# Patient Record
Sex: Female | Born: 1975 | Race: Black or African American | Hispanic: No | Marital: Single | Smoking: Never smoker
Health system: Southern US, Community
[De-identification: ages and names within clinical notes are randomized; demographics above are authoritative.]

## PROBLEM LIST (undated history)

## (undated) DIAGNOSIS — B977 Papillomavirus as the cause of diseases classified elsewhere: Secondary | ICD-10-CM

## (undated) DIAGNOSIS — N809 Endometriosis, unspecified: Secondary | ICD-10-CM

## (undated) DIAGNOSIS — F32A Depression, unspecified: Secondary | ICD-10-CM

## (undated) DIAGNOSIS — K219 Gastro-esophageal reflux disease without esophagitis: Secondary | ICD-10-CM

## (undated) DIAGNOSIS — F419 Anxiety disorder, unspecified: Secondary | ICD-10-CM

## (undated) DIAGNOSIS — D219 Benign neoplasm of connective and other soft tissue, unspecified: Secondary | ICD-10-CM

## (undated) DIAGNOSIS — D649 Anemia, unspecified: Secondary | ICD-10-CM

## (undated) DIAGNOSIS — F329 Major depressive disorder, single episode, unspecified: Secondary | ICD-10-CM

## (undated) DIAGNOSIS — G43909 Migraine, unspecified, not intractable, without status migrainosus: Secondary | ICD-10-CM

## (undated) DIAGNOSIS — E079 Disorder of thyroid, unspecified: Secondary | ICD-10-CM

## (undated) HISTORY — PX: BUNIONECTOMY: SHX129

## (undated) HISTORY — PX: DILATION AND CURETTAGE OF UTERUS: SHX78

## (undated) HISTORY — DX: Major depressive disorder, single episode, unspecified: F32.9

## (undated) HISTORY — PX: ECTOPIC PREGNANCY SURGERY: SHX613

## (undated) HISTORY — DX: Anemia, unspecified: D64.9

## (undated) HISTORY — DX: Papillomavirus as the cause of diseases classified elsewhere: B97.7

## (undated) HISTORY — DX: Migraine, unspecified, not intractable, without status migrainosus: G43.909

## (undated) HISTORY — DX: Disorder of thyroid, unspecified: E07.9

## (undated) HISTORY — DX: Depression, unspecified: F32.A

## (undated) HISTORY — DX: Endometriosis, unspecified: N80.9

## (undated) HISTORY — DX: Benign neoplasm of connective and other soft tissue, unspecified: D21.9

---

## 2000-10-24 ENCOUNTER — Inpatient Hospital Stay (HOSPITAL_COMMUNITY): Admission: AD | Admit: 2000-10-24 | Discharge: 2000-10-24 | Payer: Self-pay | Admitting: Obstetrics & Gynecology

## 2000-10-29 ENCOUNTER — Emergency Department (HOSPITAL_COMMUNITY): Admission: EM | Admit: 2000-10-29 | Discharge: 2000-10-29 | Payer: Self-pay | Admitting: Emergency Medicine

## 2001-06-27 ENCOUNTER — Ambulatory Visit (HOSPITAL_COMMUNITY): Admission: RE | Admit: 2001-06-27 | Discharge: 2001-06-27 | Payer: Self-pay | Admitting: Family Medicine

## 2001-06-27 ENCOUNTER — Encounter: Payer: Self-pay | Admitting: Family Medicine

## 2001-07-01 ENCOUNTER — Emergency Department (HOSPITAL_COMMUNITY): Admission: EM | Admit: 2001-07-01 | Discharge: 2001-07-01 | Payer: Self-pay | Admitting: Emergency Medicine

## 2001-07-03 ENCOUNTER — Emergency Department (HOSPITAL_COMMUNITY): Admission: EM | Admit: 2001-07-03 | Discharge: 2001-07-04 | Payer: Self-pay | Admitting: Emergency Medicine

## 2001-07-04 ENCOUNTER — Encounter: Payer: Self-pay | Admitting: Emergency Medicine

## 2005-01-12 ENCOUNTER — Encounter: Admission: RE | Admit: 2005-01-12 | Discharge: 2005-01-12 | Payer: Self-pay | Admitting: *Deleted

## 2008-05-14 ENCOUNTER — Emergency Department (HOSPITAL_COMMUNITY): Admission: EM | Admit: 2008-05-14 | Discharge: 2008-05-14 | Payer: Self-pay | Admitting: Emergency Medicine

## 2008-10-08 ENCOUNTER — Emergency Department (HOSPITAL_COMMUNITY): Admission: EM | Admit: 2008-10-08 | Discharge: 2008-10-08 | Payer: Self-pay | Admitting: Emergency Medicine

## 2009-04-30 ENCOUNTER — Emergency Department (HOSPITAL_COMMUNITY): Admission: EM | Admit: 2009-04-30 | Discharge: 2009-04-30 | Payer: Self-pay | Admitting: Emergency Medicine

## 2009-05-03 ENCOUNTER — Emergency Department (HOSPITAL_COMMUNITY): Admission: EM | Admit: 2009-05-03 | Discharge: 2009-05-03 | Payer: Self-pay | Admitting: Family Medicine
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
## 2009-07-10 ENCOUNTER — Emergency Department (HOSPITAL_COMMUNITY): Admission: EM | Admit: 2009-07-10 | Discharge: 2009-07-10 | Payer: Self-pay | Admitting: Emergency Medicine

## 2009-12-28 ENCOUNTER — Ambulatory Visit: Payer: Self-pay | Admitting: Radiology

## 2009-12-28 ENCOUNTER — Emergency Department (HOSPITAL_BASED_OUTPATIENT_CLINIC_OR_DEPARTMENT_OTHER): Admission: EM | Admit: 2009-12-28 | Discharge: 2009-12-28 | Payer: Self-pay | Admitting: Emergency Medicine

## 2010-12-19 ENCOUNTER — Other Ambulatory Visit: Payer: Self-pay | Admitting: Internal Medicine

## 2010-12-19 DIAGNOSIS — N6019 Diffuse cystic mastopathy of unspecified breast: Secondary | ICD-10-CM

## 2010-12-30 ENCOUNTER — Inpatient Hospital Stay (INDEPENDENT_AMBULATORY_CARE_PROVIDER_SITE_OTHER)
Admission: RE | Admit: 2010-12-30 | Discharge: 2010-12-30 | Disposition: A | Discharge status: Home / Self Care | Payer: Private Health Insurance - Indemnity | Source: Ambulatory Visit | Attending: Family Medicine | Admitting: Family Medicine

## 2010-12-30 ENCOUNTER — Ambulatory Visit (HOSPITAL_COMMUNITY)
Admission: RE | Admit: 2010-12-30 | Discharge: 2010-12-30 | Disposition: A | Discharge status: Home / Self Care | Payer: Private Health Insurance - Indemnity | Source: Ambulatory Visit | Attending: Family Medicine | Admitting: Family Medicine

## 2010-12-30 DIAGNOSIS — S6990XA Unspecified injury of unspecified wrist, hand and finger(s), initial encounter: Secondary | ICD-10-CM | POA: Insufficient documentation

## 2010-12-30 DIAGNOSIS — X58XXXA Exposure to other specified factors, initial encounter: Secondary | ICD-10-CM | POA: Insufficient documentation

## 2010-12-30 DIAGNOSIS — T148XXA Other injury of unspecified body region, initial encounter: Secondary | ICD-10-CM

## 2011-01-02 ENCOUNTER — Ambulatory Visit
Admission: RE | Admit: 2011-01-02 | Discharge: 2011-01-02 | Disposition: A | Discharge status: Home / Self Care | Payer: Private Health Insurance - Indemnity | Source: Ambulatory Visit | Attending: Internal Medicine | Admitting: Internal Medicine

## 2011-01-02 DIAGNOSIS — N6019 Diffuse cystic mastopathy of unspecified breast: Secondary | ICD-10-CM

## 2011-02-15 LAB — BASIC METABOLIC PANEL
CO2: 27 mEq/L (ref 19–32)
Calcium: 8.9 mg/dL (ref 8.4–10.5)
Chloride: 105 mEq/L (ref 96–112)
GFR calc Af Amer: >60 mL/min (ref >60)
Potassium: 4 mEq/L (ref 3.5–5.1)
Sodium: 141 mEq/L (ref 135–145)

## 2011-02-15 LAB — URINALYSIS, ROUTINE W REFLEX MICROSCOPIC
Glucose, UA: NEGATIVE mg/dL
Protein, ur: NEGATIVE mg/dL
Urobilinogen, UA: 1 mg/dL (ref 0.0–1.0)

## 2011-02-15 LAB — CBC
HCT: 38 % (ref 36.0–46.0)
Hemoglobin: 12.6 g/dL (ref 12.0–15.0)
MCHC: 33.2 g/dL (ref 30.0–36.0)
MCV: 92.1 fL (ref 78.0–100.0)
RBC: 4.12 MIL/uL (ref 3.87–5.11)

## 2011-02-15 LAB — POCT CARDIAC MARKERS: CKMB, poc: <1 ng/mL — ABNORMAL LOW (ref 1.0–8.0)

## 2011-02-15 LAB — DIFFERENTIAL
Basophils Relative: 1 % (ref 0–1)
Monocytes Absolute: 0.6 10*3/uL (ref 0.1–1.0)
Monocytes Relative: 14 % — ABNORMAL HIGH (ref 3–12)
Neutro Abs: 2.6 10*3/uL (ref 1.7–7.7)

## 2011-03-05 LAB — BASIC METABOLIC PANEL
Calcium: 8.9 mg/dL (ref 8.4–10.5)
GFR calc Af Amer: >60 mL/min (ref >60)
GFR calc non Af Amer: >60 mL/min (ref >60)
Sodium: 138 mEq/L (ref 135–145)

## 2011-03-05 LAB — DIFFERENTIAL
Basophils Absolute: 0 10*3/uL (ref 0.0–0.1)
Lymphocytes Relative: 42 % (ref 12–46)
Monocytes Absolute: 0.2 10*3/uL (ref 0.1–1.0)
Monocytes Relative: 6 % (ref 3–12)
Neutro Abs: 2.3 10*3/uL (ref 1.7–7.7)

## 2011-03-05 LAB — URINALYSIS, ROUTINE W REFLEX MICROSCOPIC
Bilirubin Urine: NEGATIVE
Ketones, ur: NEGATIVE mg/dL
Nitrite: NEGATIVE
Protein, ur: NEGATIVE mg/dL
Urobilinogen, UA: 1 mg/dL (ref 0.0–1.0)

## 2011-03-05 LAB — PREGNANCY, URINE: Preg Test, Ur: NEGATIVE

## 2011-03-05 LAB — ACETAMINOPHEN LEVEL: Acetaminophen (Tylenol), Serum: <10 ug/mL — ABNORMAL LOW (ref 10–30)

## 2011-03-05 LAB — CBC
Hemoglobin: 12.2 g/dL (ref 12.0–15.0)
RBC: 3.99 MIL/uL (ref 3.87–5.11)
RDW: 13.7 % (ref 11.5–15.5)

## 2011-03-05 LAB — RAPID URINE DRUG SCREEN, HOSP PERFORMED: Tetrahydrocannabinol: NOT DETECTED

## 2011-06-10 ENCOUNTER — Emergency Department (HOSPITAL_COMMUNITY)
Admission: EM | Admit: 2011-06-10 | Discharge: 2011-06-10 | Disposition: A | Discharge status: Home / Self Care | Payer: Private Health Insurance - Indemnity | Attending: Emergency Medicine | Admitting: Emergency Medicine

## 2011-06-10 DIAGNOSIS — R51 Headache: Secondary | ICD-10-CM | POA: Insufficient documentation

## 2011-06-10 LAB — POCT I-STAT, CHEM 8
Creatinine, Ser: 0.9 mg/dL (ref 0.50–1.10)
Hemoglobin: 12.2 g/dL (ref 12.0–15.0)
Potassium: 4.6 mEq/L (ref 3.5–5.1)
Sodium: 143 mEq/L (ref 135–145)

## 2011-06-10 LAB — POCT PREGNANCY, URINE: Preg Test, Ur: NEGATIVE

## 2011-06-10 LAB — GLUCOSE, CAPILLARY: Glucose-Capillary: 80 mg/dL (ref 70–99)

## 2011-08-23 LAB — DIFFERENTIAL
Basophils Absolute: 0
Basophils Relative: 1
Eosinophils Absolute: 0.2
Eosinophils Relative: 3
Lymphocytes Relative: 42
Lymphs Abs: 2.8
Monocytes Absolute: 0.5
Monocytes Relative: 7
Neutro Abs: 3.1
Neutrophils Relative %: 47

## 2011-08-23 LAB — POCT I-STAT, CHEM 8
BUN: 20
Calcium, Ion: 1.25
Chloride: 106
Creatinine, Ser: 1.4 — ABNORMAL HIGH
Glucose, Bld: 81
HCT: 39
Hemoglobin: 13.3
Potassium: 4.3
Sodium: 139
TCO2: 26

## 2011-08-23 LAB — CBC
HCT: 37.2
Hemoglobin: 13
MCHC: 34.9
MCV: 91.2
Platelets: 247
RBC: 4.08
RDW: 13.3
WBC: 6.6

## 2011-08-23 LAB — POCT CARDIAC MARKERS
CKMB, poc: 1.4
Myoglobin, poc: 59.8
Operator id: 234501
Troponin i, poc: <0.05

## 2011-08-29 LAB — URINALYSIS, ROUTINE W REFLEX MICROSCOPIC
Bilirubin Urine: NEGATIVE
Ketones, ur: NEGATIVE
Nitrite: NEGATIVE
Protein, ur: NEGATIVE
Specific Gravity, Urine: 1.02
Urobilinogen, UA: 0.2

## 2012-07-22 ENCOUNTER — Ambulatory Visit: Payer: Private Health Insurance - Indemnity | Admitting: Endocrinology

## 2012-07-22 DIAGNOSIS — Z0289 Encounter for other administrative examinations: Secondary | ICD-10-CM

## 2012-09-29 ENCOUNTER — Other Ambulatory Visit: Payer: Self-pay | Admitting: Otolaryngology

## 2012-09-29 DIAGNOSIS — R490 Dysphonia: Secondary | ICD-10-CM

## 2012-09-29 DIAGNOSIS — K219 Gastro-esophageal reflux disease without esophagitis: Secondary | ICD-10-CM

## 2012-09-29 DIAGNOSIS — R1310 Dysphagia, unspecified: Secondary | ICD-10-CM

## 2012-10-06 ENCOUNTER — Ambulatory Visit
Admission: RE | Admit: 2012-10-06 | Discharge: 2012-10-06 | Disposition: A | Discharge status: Home / Self Care | Payer: Medicaid Other | Source: Ambulatory Visit | Attending: Otolaryngology | Admitting: Otolaryngology

## 2012-10-06 DIAGNOSIS — K219 Gastro-esophageal reflux disease without esophagitis: Secondary | ICD-10-CM

## 2012-10-06 DIAGNOSIS — R490 Dysphonia: Secondary | ICD-10-CM

## 2012-10-06 DIAGNOSIS — R1310 Dysphagia, unspecified: Secondary | ICD-10-CM

## 2013-05-21 ENCOUNTER — Emergency Department (HOSPITAL_COMMUNITY)
Admission: EM | Admit: 2013-05-21 | Discharge: 2013-05-21 | Disposition: A | Discharge status: Home / Self Care | Payer: Medicaid Other | Attending: Emergency Medicine | Admitting: Emergency Medicine

## 2013-05-21 ENCOUNTER — Encounter (HOSPITAL_COMMUNITY): Payer: Self-pay

## 2013-05-21 ENCOUNTER — Emergency Department (HOSPITAL_COMMUNITY): Payer: Medicaid Other

## 2013-05-21 DIAGNOSIS — IMO0002 Reserved for concepts with insufficient information to code with codable children: Secondary | ICD-10-CM | POA: Insufficient documentation

## 2013-05-21 DIAGNOSIS — X58XXXA Exposure to other specified factors, initial encounter: Secondary | ICD-10-CM | POA: Insufficient documentation

## 2013-05-21 DIAGNOSIS — F411 Generalized anxiety disorder: Secondary | ICD-10-CM | POA: Insufficient documentation

## 2013-05-21 DIAGNOSIS — R110 Nausea: Secondary | ICD-10-CM | POA: Insufficient documentation

## 2013-05-21 DIAGNOSIS — Z3202 Encounter for pregnancy test, result negative: Secondary | ICD-10-CM | POA: Insufficient documentation

## 2013-05-21 DIAGNOSIS — Z79899 Other long term (current) drug therapy: Secondary | ICD-10-CM | POA: Insufficient documentation

## 2013-05-21 DIAGNOSIS — Y939 Activity, unspecified: Secondary | ICD-10-CM | POA: Insufficient documentation

## 2013-05-21 DIAGNOSIS — Y929 Unspecified place or not applicable: Secondary | ICD-10-CM | POA: Insufficient documentation

## 2013-05-21 DIAGNOSIS — R109 Unspecified abdominal pain: Secondary | ICD-10-CM | POA: Insufficient documentation

## 2013-05-21 DIAGNOSIS — S39011A Strain of muscle, fascia and tendon of abdomen, initial encounter: Secondary | ICD-10-CM

## 2013-05-21 HISTORY — DX: Anxiety disorder, unspecified: F41.9

## 2013-05-21 LAB — CBC WITH DIFFERENTIAL/PLATELET
Basophils Absolute: 0 10*3/uL (ref 0.0–0.1)
Basophils Relative: 1 % (ref 0–1)
Eosinophils Relative: 1 % (ref 0–5)
HCT: 34.1 % — ABNORMAL LOW (ref 36.0–46.0)
MCHC: 34.9 g/dL (ref 30.0–36.0)
MCV: 87.9 fL (ref 78.0–100.0)
Monocytes Absolute: 0.4 10*3/uL (ref 0.1–1.0)
Monocytes Relative: 9 % (ref 3–12)
RDW: 12.6 % (ref 11.5–15.5)

## 2013-05-21 LAB — URINALYSIS, ROUTINE W REFLEX MICROSCOPIC
Bilirubin Urine: NEGATIVE
Glucose, UA: NEGATIVE mg/dL
Hgb urine dipstick: NEGATIVE
Ketones, ur: 15 mg/dL — AB
Protein, ur: NEGATIVE mg/dL

## 2013-05-21 LAB — POCT PREGNANCY, URINE: Preg Test, Ur: NEGATIVE

## 2013-05-21 LAB — BASIC METABOLIC PANEL
BUN: 14 mg/dL (ref 6–23)
CO2: 25 mEq/L (ref 19–32)
Calcium: 9.1 mg/dL (ref 8.4–10.5)
Creatinine, Ser: 1.07 mg/dL (ref 0.50–1.10)
GFR calc Af Amer: 75 mL/min — ABNORMAL LOW (ref >90)

## 2013-05-21 NOTE — ED Notes (Addendum)
This nurse went to discharge this patient but the patient was no longer in the room. Patient left the hospital prior to discharge instructions. No belongings left in room.

## 2013-05-21 NOTE — ED Provider Notes (Signed)
History    CSN: [id number] Arrival date & time 05/21/13  6962  First MD Initiated Contact with Patient 05/21/13 [phone]     Chief Complaint  Patient presents with  . Hip Pain   (Consider location/radiation/quality/duration/timing/severity/associated sxs/prior Treatment) Patient is a 37 y.o. female presenting with abdominal pain. The history is provided by the patient.  Abdominal Pain This is a new problem. The current episode started more than 1 month ago (about two months ago). The problem occurs constantly (intermittent at first, now constant for past week). The problem has been gradually worsening. Associated symptoms include abdominal pain and nausea. Pertinent negatives include no anorexia, chest pain, chills, congestion, fever, vomiting or weakness. Nothing aggravates the symptoms. She has tried nothing for the symptoms.   Past Medical History  Diagnosis Date  . Anxiety    Past Surgical History  Procedure Laterality Date  . Cesarean section    . Ectopic pregnancy surgery     No family history on file. History  Substance Use Topics  . Smoking status: Never Smoker   . Smokeless tobacco: Not on file  . Alcohol Use: No   OB History   Grav Para Term Preterm Abortions TAB SAB Ect Mult Living                 Review of Systems  Constitutional: Negative for fever and chills.  HENT: Negative for congestion and rhinorrhea.   Respiratory: Negative for chest tightness and shortness of breath.   Cardiovascular: Negative for chest pain.  Gastrointestinal: Positive for nausea and abdominal pain. Negative for vomiting, diarrhea and anorexia.  Genitourinary: Negative for dysuria, hematuria and difficulty urinating.  Musculoskeletal: Negative for back pain.  Neurological: Negative for dizziness and weakness.  All other systems reviewed and are negative.    Allergies  Penicillins and Latex  Home Medications  No current outpatient prescriptions on file. BP 107/73  Pulse 85   Temp(Src) 98.6 F (37 C) (Oral)  Resp 16  Ht 5\' 2"  (1.575 m)  Wt 170 lb (77.111 kg)  BMI 31.09 kg/m2  SpO2 100%  LMP 04/26/2013 Physical Exam  Nursing note and vitals reviewed. Constitutional: She is oriented to person, place, and time. She appears well-developed and well-nourished. No distress.  HENT:  Head: Normocephalic and atraumatic.  Mouth/Throat: Oropharynx is clear and moist.  Eyes: EOM are normal. Pupils are equal, round, and reactive to light.  Neck: Normal range of motion. Neck supple.  Cardiovascular: Normal rate, regular rhythm and normal heart sounds.  Exam reveals no friction rub.   No murmur heard. Pulmonary/Chest: Effort normal and breath sounds normal. No respiratory distress. She has no wheezes. She has no rales.  Abdominal: Soft. There is no tenderness. There is no rebound and no guarding.  No TTP right flank or abdomen to deep palpation.   Musculoskeletal: Normal range of motion. She exhibits no edema and no tenderness.  Lymphadenopathy:    She has no cervical adenopathy.  Neurological: She is alert and oriented to person, place, and time.  Skin: Skin is warm and dry. No rash noted.  Psychiatric: She has a normal mood and affect. Her behavior is normal.    ED Course  Procedures (including critical care time) Labs Reviewed  URINALYSIS, ROUTINE W REFLEX MICROSCOPIC - Abnormal; Notable for the following:    Color, Urine AMBER (*)    APPearance CLOUDY (*)    Specific Gravity, Urine 1.033 (*)    Ketones, ur 15 (*)    All  other components within normal limits  CBC WITH DIFFERENTIAL - Abnormal; Notable for the following:    WBC 3.8 (*)    Hemoglobin 11.9 (*)    HCT 34.1 (*)    All other components within normal limits  BASIC METABOLIC PANEL - Abnormal; Notable for the following:    GFR calc non Af Amer 65 (*)    GFR calc Af Amer 75 (*)    All other components within normal limits  POCT PREGNANCY, URINE   Ct Abdomen Pelvis Wo Contrast  05/21/2013    *RADIOLOGY REPORT*  Clinical Data: Right-sided hip, pelvic and flank pain for several weeks  CT ABDOMEN AND PELVIS WITHOUT CONTRAST  Technique:  Multidetector CT imaging of the abdomen and pelvis was performed following the standard protocol without intravenous contrast.  Comparison: None.  Findings:  The lack of intravenous contrast limits the ability to evaluate solid abdominal organs.  Normal noncontrast appearance of the bilateral kidneys.  No definite renal stones are seen within either kidney, the urinary bladder or along the expected location of either ureter.  Multiple phleboliths are seen within the pelvis.  Normal noncontrast appearance of the urinary bladder given under distension.  No urinary obstruction or perinephric stranding.  Normal hepatic contour.  Normal noncontrast appearance of a slightly under distended gallbladder.  No ascites.  Normal noncontrast appearance of the right adrenal gland, pancreas and spleen.  There is mild diffuse thickening of the left mid gland without discrete nodule on this noncontrast examination.  Moderate colonic stool burden without evidence of obstruction.  The colon is incidentally noted to be located within the right mid hemiabdomen.  No evidence of enteric obstruction.  Normal appearance of the appendix which is noted to extend caudally to the inferior aspect the right lobe of the liver.  No pneumoperitoneum, pneumatosis or portal venous gas.  Normal caliber of the abdominal aorta.  No definite retroperitoneal, mesenteric, pelvic or inguinal lymphadenopathy on this noncontrast examination.  Normal noncontrast appearance of the pelvic organs for age.  No discrete adnexal lesion.  No free fluid in the pelvis.  Limited visualization of the lower thorax is negative for focal airspace opacity or pleural effusion.  Normal heart size.  No pericardial effusion.  No acute or aggressive osseous abnormalities.  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  This  finding is without associated anterolisthesis.  Limited visualization of the bilateral hips is normal.  IMPRESSION:  1.  No explanation for patient's right sided hip, pelvic and flank pain.  Specifically, no evidence of nephrolithiasis, urinary or enteric obstruction.  2.  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   Original Report Authenticated By: Tacey Ruiz, MD   1. Abdominal pain   2. Abdominal wall strain, initial encounter     MDM  86:32 AM 37 year old female with no medical problems presenting with roughly 2 months of right flank pain. She states pain was initially intermittent, but for the past week has been constant and is now waking her up out of sleep. She endorses nausea but no vomiting. No fevers or diarrhea. No history of similar in the past. She denies any exacerbating or relieving factors. She states she was worked up with ultrasound by her primary physician which was negative. Reports last week her chiropractor did what sounds to be a lateral x-ray and told her there was a "mass". She appears well on exam. I am unable to elicit tenderness in her  abdomen with deep palpation. She does state the pain radiates to her groin, but she does not appear to have an acutely impacted stone. Will obtain CT stone protocol to evaluate but also to get a better look at her abdomen.  11:56 AM labs show no significant abnormality. CT unremarkable. Overall picture consistent with probable abdominal wall strain. Discussed with patient. She'll followup with her primary physician for reevaluation. Counseled on use of Motrin and Tylenol as needed for pain. She voiced understanding and was discharged home in stable condition.  Caren Hazy, MD 05/21/13 [phone]

## 2013-05-21 NOTE — ED Notes (Signed)
Dr. James at bedside  

## 2013-05-21 NOTE — ED Notes (Signed)
Per patient statement, right side pain x 3 months. Was told muscle spasms by PCP but second opinion byChiropractor who did back x-ray and found 2cm mass located on R hip area.  Pt states pain increasing.

## 2013-05-22 NOTE — ED Provider Notes (Signed)
I saw and evaluated the patient, reviewed the resident's note and I agree with the findings and plan. Abdominal "mass". CT scan done and is reassuring. Discharge home  Brenda Patton. Rubin Payor, MD 05/22/13 [phone]

## 2013-12-21 ENCOUNTER — Other Ambulatory Visit: Payer: Self-pay | Admitting: Medical

## 2013-12-22 ENCOUNTER — Other Ambulatory Visit: Payer: Self-pay | Admitting: Medical

## 2013-12-22 DIAGNOSIS — N6452 Nipple discharge: Secondary | ICD-10-CM

## 2013-12-22 DIAGNOSIS — N63 Unspecified lump in unspecified breast: Secondary | ICD-10-CM

## 2014-01-07 ENCOUNTER — Ambulatory Visit
Admission: RE | Admit: 2014-01-07 | Discharge: 2014-01-07 | Disposition: A | Discharge status: Home / Self Care | Payer: No Typology Code available for payment source | Source: Ambulatory Visit | Attending: Medical | Admitting: Medical

## 2014-01-07 ENCOUNTER — Other Ambulatory Visit: Payer: Self-pay | Admitting: Medical

## 2014-01-07 DIAGNOSIS — N6452 Nipple discharge: Secondary | ICD-10-CM

## 2014-01-07 DIAGNOSIS — N6320 Unspecified lump in the left breast, unspecified quadrant: Secondary | ICD-10-CM

## 2014-02-25 ENCOUNTER — Encounter (HOSPITAL_COMMUNITY): Payer: Self-pay | Admitting: Emergency Medicine

## 2014-02-25 ENCOUNTER — Emergency Department (HOSPITAL_COMMUNITY)
Admission: EM | Admit: 2014-02-25 | Discharge: 2014-02-25 | Disposition: A | Discharge status: Home / Self Care | Payer: No Typology Code available for payment source | Attending: Emergency Medicine | Admitting: Emergency Medicine

## 2014-02-25 ENCOUNTER — Emergency Department (HOSPITAL_COMMUNITY): Payer: No Typology Code available for payment source

## 2014-02-25 DIAGNOSIS — R0789 Other chest pain: Secondary | ICD-10-CM | POA: Insufficient documentation

## 2014-02-25 DIAGNOSIS — Z91040 Latex allergy status: Secondary | ICD-10-CM | POA: Insufficient documentation

## 2014-02-25 DIAGNOSIS — Z79899 Other long term (current) drug therapy: Secondary | ICD-10-CM | POA: Insufficient documentation

## 2014-02-25 DIAGNOSIS — R0602 Shortness of breath: Secondary | ICD-10-CM | POA: Insufficient documentation

## 2014-02-25 DIAGNOSIS — R079 Chest pain, unspecified: Secondary | ICD-10-CM

## 2014-02-25 DIAGNOSIS — F411 Generalized anxiety disorder: Secondary | ICD-10-CM | POA: Insufficient documentation

## 2014-02-25 DIAGNOSIS — Z880 Allergy status to penicillin: Secondary | ICD-10-CM | POA: Insufficient documentation

## 2014-02-25 LAB — CBC
HCT: 34.5 % — ABNORMAL LOW (ref 36.0–46.0)
HEMOGLOBIN: 11.7 g/dL — AB (ref 12.0–15.0)
MCH: 31 pg (ref 26.0–34.0)
MCHC: 33.9 g/dL (ref 30.0–36.0)
MCV: 91.3 fL (ref 78.0–100.0)
PLATELETS: 214 10*3/uL (ref 150–400)
RBC: 3.78 MIL/uL — AB (ref 3.87–5.11)
RDW: 12.9 % (ref 11.5–15.5)
WBC: 6.8 10*3/uL (ref 4.0–10.5)

## 2014-02-25 LAB — BASIC METABOLIC PANEL
BUN: 19 mg/dL (ref 6–23)
CHLORIDE: 106 meq/L (ref 96–112)
CO2: 22 meq/L (ref 19–32)
Calcium: 8.8 mg/dL (ref 8.4–10.5)
Creatinine, Ser: 1.17 mg/dL — ABNORMAL HIGH (ref 0.50–1.10)
GFR calc Af Amer: 67 mL/min — ABNORMAL LOW (ref >90)
GFR calc non Af Amer: 58 mL/min — ABNORMAL LOW (ref >90)
Glucose, Bld: 98 mg/dL (ref 70–99)
Potassium: 4.1 mEq/L (ref 3.7–5.3)
SODIUM: 139 meq/L (ref 137–147)

## 2014-02-25 LAB — TROPONIN I

## 2014-02-25 MED ORDER — FAMOTIDINE 20 MG PO TABS: 20.0000 mg | ORAL_TABLET | Freq: Two times a day (BID) | ORAL | Status: DC

## 2014-02-25 NOTE — ED Notes (Signed)
The pt has had mid chest discomfort for the past 3 days with some sob.  None now.  She woke up this am and felt a heaviness in her chest.  No previous history.  lmp  March 15th

## 2014-02-25 NOTE — ED Provider Notes (Signed)
CSN: [id number]     Arrival date & time 02/25/14  0204 History   First MD Initiated Contact with Patient 02/25/14 [phone]     Chief Complaint  Patient presents with  . Chest Pain     (Consider location/radiation/quality/duration/timing/severity/associated sxs/prior Treatment) HPI Comments: 38 year old female with no Past medical history other than anxiety who presents with a complaint of chest pain. This is a discomfort that she has felt in her chest.  It is a heaviness or squeezing sensation, it started 3 days ago, it occurs only at night for the first 2 days and then has been present for the last 24 hours. Nothing seems to make this better or worse, it is not associated with position, supine position, exertion or deep breathing. She has some shortness of breath when she wakes up but this resolves spontaneously. She had some nausea this evening but this has resolved as well. She has no risk factors for heart disease, no risk factors for pulmonary embolism and denies any pain in her back, upper extremities or swelling of the lower extremities. She also denies fevers, cough. She had her regular yearly physical at her doctor's office 1 week ago at which time she states she had no symptoms but a screening EKG and chest x-ray were done, she reports them as normal  Patient is a 38 y.o. female presenting with chest pain. The history is provided by the patient.  Chest Pain   Past Medical History  Diagnosis Date  . Anxiety    Past Surgical History  Procedure Laterality Date  . Cesarean section    . Ectopic pregnancy surgery     No family history on file. History  Substance Use Topics  . Smoking status: Never Smoker   . Smokeless tobacco: Not on file  . Alcohol Use: No   OB History   Grav Para Term Preterm Abortions TAB SAB Ect Mult Living                 Review of Systems  Cardiovascular: Positive for chest pain.  All other systems reviewed and are negative.      Allergies   Penicillins and Latex  Home Medications   Current Outpatient Rx  Name  Route  Sig  Dispense  Refill  . clonazePAM (KLONOPIN) 0.5 MG tablet   Oral   Take 0.5 mg by mouth at bedtime as needed for anxiety.         . Multiple Vitamin (MULTI-VITAMIN DAILY PO)   Oral   Take 1 tablet by mouth daily.         . propranolol (INDERAL) 10 MG tablet   Oral   Take 10 mg by mouth 3 (three) times daily.         . SUMAtriptan (IMITREX) 25 MG tablet   Oral   Take 25 mg by mouth every 2 (two) hours as needed for migraine or headache. May repeat in 2 hours if headache persists or recurs.         . famotidine (PEPCID) 20 MG tablet   Oral   Take 1 tablet (20 mg total) by mouth 2 (two) times daily.   30 tablet   0    BP 107/66  Pulse 66  Temp(Src) 98 F (36.7 C) (Oral)  Resp 13  SpO2 100%  LMP 02/07/2014 Physical Exam  Nursing note and vitals reviewed. Constitutional: She appears well-developed and well-nourished. No distress.  HENT:  Head: Normocephalic and atraumatic.  Mouth/Throat: Oropharynx is clear  and moist. No oropharyngeal exudate.  Eyes: Conjunctivae and EOM are normal. Pupils are equal, round, and reactive to light. Right eye exhibits no discharge. Left eye exhibits no discharge. No scleral icterus.  Neck: Normal range of motion. Neck supple. No JVD present. No thyromegaly present.  Cardiovascular: Normal rate, regular rhythm, normal heart sounds and intact distal pulses.  Exam reveals no gallop and no friction rub.   No murmur heard. Pulmonary/Chest: Effort normal and breath sounds normal. No respiratory distress. She has no wheezes. She has no rales.  Abdominal: Soft. Bowel sounds are normal. She exhibits no distension and no mass. There is no tenderness.  Musculoskeletal: Normal range of motion. She exhibits no edema and no tenderness.  Lymphadenopathy:    She has no cervical adenopathy.  Neurological: She is alert. Coordination normal.  Skin: Skin is warm and  dry. No rash noted. No erythema.  Psychiatric: She has a normal mood and affect. Her behavior is normal.    ED Course  Procedures (including critical care time) Labs Review Labs Reviewed  CBC - Abnormal; Notable for the following:    RBC 3.78 (*)    Hemoglobin 11.7 (*)    HCT 34.5 (*)    All other components within normal limits  BASIC METABOLIC PANEL - Abnormal; Notable for the following:    Creatinine, Ser 1.17 (*)    GFR calc non Af Amer 58 (*)    GFR calc Af Amer 67 (*)    All other components within normal limits  TROPONIN I   Imaging Review Dg Chest 2 View  02/25/2014   CLINICAL DATA:  Chest tightness, palpitations and shortness of breath.  EXAM: CHEST  2 VIEW  COMPARISON:  Chest radiograph performed 12/28/2009  FINDINGS: The lungs are well-aerated and clear. There is no evidence of focal opacification, pleural effusion or pneumothorax.  The heart is normal in size; the mediastinal contour is within normal limits. No acute osseous abnormalities are seen.  IMPRESSION: No acute cardiopulmonary process seen.   Electronically Signed   By: Garald Balding M.D.   On: 02/25/2014 02:57     EKG Interpretation   Date/Time:  Thursday February 25 2014 02:09:03 EDT Ventricular Rate:  82 PR Interval:  156 QRS Duration: 88 QT Interval:  368 QTC Calculation: 429 R Axis:   59 Text Interpretation:  Normal sinus rhythm Normal ECG since last tracing no  significant change Confirmed by Gaither Biehn  MD, Jarrius Huaracha ([id number]) on 02/25/2014  2:38:56 AM      MDM   Final diagnoses:  Chest pain    Well appearing, no acute findings on exam, EKG is unremarkable and is totally normal with no ischemia. The patient has been having ongoing symptoms for greater than 24 hours, unlikely to be acute coronary syndrome, the patient has no risk factors for pulmonary embolism, she does have a history of anxiety and reports waking up with symptoms of not Being able to breathe and chest pain which resolved.  She has minimal  sx at this time with normal VS  Symptoms have improved significantly without intervention, workup including EKG, labs and chest x-ray is unremarkable, stable for discharge. Patient informed of results prior to discharge and is in agreement with the plan.   Meds given in ED:  Medications - No data to display  New Prescriptions   FAMOTIDINE (PEPCID) 20 MG TABLET    Take 1 tablet (20 mg total) by mouth 2 (two) times daily.      Aaron Edelman  Amparo Bristol, MD 02/25/14 [phone]

## 2014-02-25 NOTE — Discharge Instructions (Signed)
Your caregiver has diagnosed you as having chest pain that is nonspecific for one problem. This means that after looking at you and examining you and ordering tests (such as blood work, chest x-rays and EKG), your caregiver does not believe that the problem is serious enough to need watching in the hospital. This judgment is often made after testing shows no acute heart attack and you are at low risk for sudden acute heart condition. Chest pain comes from many different causes.  Seek immediate medical attention if:   You have severe chest pain, especially if the pain is crushing or pressure-like and spreads to the arms, back, neck, or jaw, or if you have sweating, nausea, shortness of breath. This is an emergency. Don't wait to see if the pain will go away. Get medical help at once. Call 911 immediately. Do not drive herself to the hospital.   Your chest pain gets worse and does not go away with rest.   You have an attack of chest pain lasting longer than usual, despite rest and treatment with the medications your caregiver has prescribed   You awaken from sleep with chest pain or shortness of breath.   You feel faint or dizzy   You have chest pain not typical of your usual pain for which you originally saw your caregiver.  You must have a repeat evaluation within 24 hours for a recheck of your heart.  Please call your doctor this morning to schedule this appointment. If you do not have a family doctor, please see the list of doctors below.  RESOURCE GUIDE  Dental Problems  Patients with Medicaid: [facility]                     [facility] [address]                                           [address] Phone:  [phone]                                                  Phone:  [phone]  If unable to pay or uninsured, contact:  [facility] or [facility] to become qualified for the adult dental clinic.  Chronic Pain  Problems Contact [facility]  [phone] Patients need to be referred by their primary care doctor.  Insufficient Money for Medicine Contact [employer]:  call "211" or [facility] 271-5999.  No Primary Care Doctor Call [facility]  832-8000 Other agencies that provide inexpensive medical care    [facility]  832-8035    [facility]  832-7272    [facility]  [phone]    Women[facility] Clinic  832-4777    [facility]  [phone]    [facility]  [phone]  Psychological Services [facility]  832-9600 [facility]  [phone] [facility]   800 853-5163 (emergency services 641-4993)  Substance Abuse Resources [facility]  336-882-2125 [facility] 336-784-9470 [facility] 336-285-9073 [facility] 336-845-3988 Residential & Outpatient Substance Abuse Program  800-659-3381  Abuse/Neglect [place] Child Abuse Hotline (336) 641-3795 [place] [facility] Abuse Hotline 800-378-5315 (After Hours)  Emergency Shelter [facility] (336) 271-5985  [facility] (336) 275-9566 [facility] (704) 372-4663  MRSA Hotline #:   832-7006    [place] Resources  [facility] [place]     [employer]                          [facility] [address] [place]                       [address]      [address]  [place]                                                [place]                            [place] Phone:  [phone]                                   Phone:  [phone]                 Phone:  [phone]  [facility] Phone:  342-8316  [place] Child Abuse Hotline (336) 342-1394 (336) 342-3537 (After Hours)    

## 2014-02-25 NOTE — ED Notes (Signed)
Patient transported to X-ray 

## 2014-05-04 ENCOUNTER — Ambulatory Visit (INDEPENDENT_AMBULATORY_CARE_PROVIDER_SITE_OTHER): Payer: No Typology Code available for payment source

## 2014-05-04 VITALS — BP 115/72 | HR 85 | Resp 14 | Ht 62.5 in | Wt 180.0 lb

## 2014-05-04 DIAGNOSIS — M2040 Other hammer toe(s) (acquired), unspecified foot: Secondary | ICD-10-CM

## 2014-05-04 DIAGNOSIS — M21619 Bunion of unspecified foot: Secondary | ICD-10-CM

## 2014-05-04 DIAGNOSIS — M21621 Bunionette of right foot: Secondary | ICD-10-CM

## 2014-05-04 DIAGNOSIS — M2010 Hallux valgus (acquired), unspecified foot: Secondary | ICD-10-CM

## 2014-05-04 DIAGNOSIS — M79609 Pain in unspecified limb: Secondary | ICD-10-CM

## 2014-05-04 NOTE — Patient Instructions (Signed)
Pre-Operative Instructions  Congratulations, you have decided to take an important step to improving your quality of life.  You can be assured that the doctors of [facility] will be with you every step of the way.  1. Plan to be at the surgery center/hospital at least 1 (one) hour prior to your scheduled time unless otherwise directed by the surgical center/hospital staff.  You must have a responsible adult accompany you, remain during the surgery and drive you home.  Make sure you have directions to the surgical center/hospital and know how to get there on time. 2. For hospital based surgery you will need to obtain a history and physical form from your family physician within 1 month prior to the date of surgery- we will give you a form for you primary physician.  3. We make every effort to accommodate the date you request for surgery.  There are however, times where surgery dates or times have to be moved.  We will contact you as soon as possible if a change in schedule is required.   4. No Aspirin/Ibuprofen for one week before surgery.  If you are on aspirin, any non-steroidal anti-inflammatory medications (Mobic, Aleve, Ibuprofen) you should stop taking it 7 days prior to your surgery.  You make take Tylenol  For pain prior to surgery.  5. Medications- If you are taking daily heart and blood pressure medications, seizure, reflux, allergy, asthma, anxiety, pain or diabetes medications, make sure the surgery center/hospital is aware before the day of surgery so they may notify you which medications to take or avoid the day of surgery. 6. No food or drink after midnight the night before surgery unless directed otherwise by surgical center/hospital staff. 7. No alcoholic beverages 24 hours prior to surgery.  No smoking 24 hours prior to or 24 hours after surgery. 8. Wear loose pants or shorts- loose enough to fit over bandages, boots, and casts. 9. No slip on shoes, sneakers are best. 10. Bring  your boot with you to the surgery center/hospital.  Also bring crutches or a walker if your physician has prescribed it for you.  If you do not have this equipment, it will be provided for you after surgery. 11. If you have not been contracted by the surgery center/hospital by the day before your surgery, call to confirm the date and time of your surgery. 12. Leave-time from work may vary depending on the type of surgery you have.  Appropriate arrangements should be made prior to surgery with your employer. 13. Prescriptions will be provided immediately following surgery by your doctor.  Have these filled as soon as possible after surgery and take the medication as directed. 14. Remove nail polish on the operative foot. 15. Wash the night before surgery.  The night before surgery wash the foot and leg well with the antibacterial soap provided and water paying special attention to beneath the toenails and in between the toes.  Rinse thoroughly with water and dry well with a towel.  Perform this wash unless told not to do so by your physician.  Enclosed: 1 Ice pack (please put in freezer the night before surgery)   1 Hibiclens skin cleaner   Pre-op Instructions  If you have any questions regarding the instructions, do not hesitate to call our office.  [place]: [address] [place], [place] [address] [phone]  [place]: [address] [place], [place] [address] [phone]  [place]: [address]  [place], [place] [address] [phone]  Dr. Jezreel Justiniano   Tuchman DPM, Dr. Norman Regal DPM Dr. Jalesha Plotz DPM, Dr. M. Todd Hyatt DPM, Dr. Kathryn Egerton DPM 

## 2014-05-04 NOTE — Progress Notes (Signed)
Subjective:    Patient ID: Brenda Patton, female    DOB: 04-06-1976, 38 y.o.   MRN: [id number]  HPI Comments: N bunion and hammer toes L right 1st MPJ and 2, 3, 4th hammer toes D over 6 months O worsening C enlarged 1st MPJ, and contracted 2 - 4 th toes A enclosed shoes rub area T OTC inserts  Foot Pain      Review of Systems  All other systems reviewed and are negative.      Objective:   Physical Exam 38 year old after your condition and options this time well-developed well-nourished oriented x3 with a complaint of painful not spur areas of her right foot in particular plain to the bunion as well as hammertoes second third and fourth toes of the foot as well as tailor bunion deformity patient indicates these toes and bunions rubbing against her shoes they're painful aggravated with any standing or walking any significan time .  Lower extremity objective findings as follows vascular status appears to be intact with pedal pulses palpable DP +2/4 PT +2/4 capillary refill time 3 seconds all digits skin temperature warm turgor normal no edema rubor pallor or varicosities noted neurologically epicritic and proprioceptive sensations intact there is normal plantar response DTRs not elicited dermatologically skin color pigment normal hair growth absent nails somewhat criptotic orthopedic biomechanical exam is notable bunion deformity as well as hammertoe deformities with semirigid contractures 234 and adductovarus rotated fifth digit there is proximal the tailor bunion deformity as well there is some diffuse keratoses of second third and fourth digit as well as fifth metatarsal and a keratosis erythema first MTP area right foot left foot has deformities although not as severe and not is rigid no complaints of pain on the left foot the current time. X-rays taken at this time reveal minimal elevated I am in agreement 12 rigid digital contractures 23 and 4 with hammertoe deformities as well  as likely tailor bunion deformity lateral deviation of the fifth metatarsal noted. No signs of fracture no other osseous abnormalities are identified       Assessment & Plan:  Assessment this time is #1 hallux abductovalgus deformity right foot as well as hammertoe deformities 23 and 4 as well as to bunion deformity of fifth metatarsal of the of the right foot. Patient does have notable capsulitis pain tenderness difficulty arthropathy with activities include shoe wear and ambulation. Patient has tried changing shoes pads cushions and wishes to address is the more permanent basis physician surgical correction cancer second toe is long she is wearing wider larger shoes worse a size E to have wide which was verified are confirmed patient correct sizing 8 a half a D. width. Patient is a candidate this time for surgical intervention and we discussed surgical options including Austin bunionectomy with K wire fixation as well as hammertoe repairs toes 23 and 4 and tailor bunionectomy with ostectomy of the fifth metatarsal head laterally. Patient wished to not have any metal left in her foot when the procedures completed we discussed either absorbable material a right suggested that there is more reactivity and complications from those and she wishes to avoid that however a stainless steel metal screw K wire fixation can be utilized temporarily including for the great toe joint or bunion correction. Her patient was advised that we would do percutaneous pinning for hammertoe repair were doing arthrodesis hours there is motion at reflex maintain motion at the MTP joint would like to do either closed fixation  versus percutaneous pinning around the joint which would limit walking duties ambulation and possibly increased risk of infection recommendations for standard Austin bunionectomy with camera fixation however plan to remove the fixation after 2-3 months once adequate bone healing has been achieved back to be done in  the office under local anesthetic block. Patient was amendable to that option as alternative to a synthetic for absorbable implants. The consent form for [facility] with reviewed as well as hammertoe repair with digital tourniquet fixation 23 and 4 as well as a simple tailor bunionectomy/ostectomy of fifth metatarsal head. The risk L2 is reviewed all questions asked medication are answered there no complications and consent form signed and surgery scheduled at her convenience to be done under IV sedation and a local or regional anesthetic block  Harriet Masson DPM

## 2014-05-07 ENCOUNTER — Ambulatory Visit: Payer: Self-pay

## 2014-05-17 ENCOUNTER — Telehealth: Payer: Self-pay | Admitting: *Deleted

## 2014-05-17 DIAGNOSIS — M21619 Bunion of unspecified foot: Secondary | ICD-10-CM

## 2014-05-17 DIAGNOSIS — M2040 Other hammer toe(s) (acquired), unspecified foot: Secondary | ICD-10-CM

## 2014-05-17 DIAGNOSIS — M2010 Hallux valgus (acquired), unspecified foot: Secondary | ICD-10-CM

## 2014-05-17 NOTE — Telephone Encounter (Signed)
Had surgery today.  I want some crutches.  They said they would not authorize the crutches.  They said he would have to authorize a pair of crutches so it will be covered by insurance.  He has to call for a pair and he didn't!  Ask him to authorize the crutches so I can get around.

## 2014-05-17 NOTE — Telephone Encounter (Signed)
I need 2 things, a copy of my operative note for myself and a pair of crutches.  The insurance will not pay unless Dr. Blenda Mounts signs off for them.  Someone give me a phone call back.  Thank You.  I left her a message that Dr. Blenda Mounts normally doesn't prescribe crutches for these particular procedures.  He wants you to be weightbearing.   I will ask him tomorrow and call you back with his response.  I also informed her we have not received the operative report yet from the transcriptionist.  We will give you a copy as soon as we get it.

## 2014-05-18 ENCOUNTER — Other Ambulatory Visit: Payer: Self-pay

## 2014-05-18 MED ORDER — TRAMADOL HCL 50 MG PO TABS: 100.0000 mg | ORAL_TABLET | Freq: Four times a day (QID) | ORAL | Status: DC | PRN

## 2014-05-18 MED ORDER — IBUPROFEN 600 MG PO TABS: 600.0000 mg | ORAL_TABLET | Freq: Four times a day (QID) | ORAL | Status: DC | PRN

## 2014-05-18 NOTE — Telephone Encounter (Signed)
I called and informed her that Dr. Blenda Mounts said that is the strongest pain medication he can prescribe.  I told her he said to loosen the ace bandage.  She said take this boot off.  I told her yes.  She took it off while I waited.  I told her not to take the gauze off.  She took the ace off and replaced it loosely.  She said that didn't help.  I told her to elevate the foot above the heart level.  She said I been doing that.  She asked if he could get a shot in the butt.  I told her we do not carry things in the office like that.  She said I done made a mistake.  He has gotten his money and he doesn't care anything about me.  I reiterated about taking the Ibuprofen or Advil in between the dosages of Percocet.  She stated I don't have any of that.  She said I want to schedule an appointment to see him on tomorrow.  I told her all the schedulers are gone for the day.  She stated he got me taking this boot and ace off, he should be doing this not me.  She said tell him I want something else and I want it now!  I told her I would pass it along.  She said let me talk to him.   I told her he was still seeing patients.  She said well tell him I want something else and I want it now!  I told her I would.

## 2014-05-18 NOTE — Telephone Encounter (Signed)
I called and gave her Dr. Erasmo Downer response that there's nothing stronger he can prescribe.  I told her she can supplement with Advil or Ibuprofen.  She stated I know that's a lie.  There's all sorts of pain medicines out there!  He needs to give me something!  I'm in a lot of pain!  I can't keep going around in pain like this.  He's going to give me something!  I can't believe he said there's nothing else!  I told her I would let him know.  She said you do that!

## 2014-05-18 NOTE — Telephone Encounter (Signed)
Patient is RE taking oxycodone is the strongest medicine should get there is nothing stronger. The only thing that she can take Advil or ibuprofen between her doses take 2 Advil 3 times a day to 4 times a day as needed for pain. Keep feet elevated and use ice pack.  Harriet Masson DPM

## 2014-05-18 NOTE — Telephone Encounter (Signed)
The surgery does not require nonweightbearing, in other words she can put her weight on her foot and walk without the use of crutches. However if she continues to want crutches or a cane she can obtain those at any drugstore or medical supply she would have to swing by here and pick up a prescription for crutches and her insurance can hopefully reimburse her for the crutches next  Harriet Masson DPM

## 2014-05-18 NOTE — Telephone Encounter (Signed)
Dr. Blenda Mounts called the patient.  He wrote her a prescription for Tramadol and Ibuprofen.  She will have her mother pick it up tomorrow.

## 2014-05-18 NOTE — Telephone Encounter (Signed)
I called and informed the patient that Dr. Blenda Mounts doesn't want her non-weight bearing.  He wants you to walk on the foot.  She stated my foot hurts, I can't put weight on it.  I told her if she insistent upon getting them she can purchase them at any drugstore or we can give her a prescription to [facility] off of [address]  She asked if her insurance would cover it.  I told her I don't know.  She said she wanted the prescription.  She asked if I got her other message.  I told her no.  She stated that the Percocet is not working.  I been taking 2 pills every 4 hours.  It doesn't take away the pain, it just makes me sleepy.  She asked if he could call in something else to her pharmacy.  I told her no if he okays something it will have to be picked up.  She asked if her mother, Jaritza Duignan, could pick it up for her.  I told her she would have to show identification and sign for it.  She also asked if we had the operative note yet.  I told her I haven't seen it yet, it's coming from the surgical center's transcriptionist.

## 2014-05-19 ENCOUNTER — Telehealth: Payer: Self-pay

## 2014-05-19 NOTE — Telephone Encounter (Signed)
Callled pt to follow up on post operative status. Left a vm

## 2014-05-20 NOTE — Progress Notes (Signed)
Dr Blenda Mounts performed a right [place] and a right Tailors bunionectomy 5th met on 05/17/14. Percocet 5/388m #50  1-2 tabs Q6hrs prn pain. Keflex 5010m#10 1 tab Q6hrs DOS 05/17/14

## 2014-05-21 ENCOUNTER — Telehealth: Payer: Self-pay | Admitting: *Deleted

## 2014-05-21 NOTE — Telephone Encounter (Signed)
Order sent to [facility] for crutches upon patient's request.  Diagnosis is Post Operative.  Demographics, history and physical, and medication list was sent.

## 2014-05-25 ENCOUNTER — Ambulatory Visit (INDEPENDENT_AMBULATORY_CARE_PROVIDER_SITE_OTHER): Payer: No Typology Code available for payment source

## 2014-05-25 VITALS — BP 107/65 | HR 66 | Resp 14 | Ht 62.5 in | Wt 180.0 lb

## 2014-05-25 DIAGNOSIS — Z09 Encounter for follow-up examination after completed treatment for conditions other than malignant neoplasm: Secondary | ICD-10-CM

## 2014-05-25 DIAGNOSIS — M2041 Other hammer toe(s) (acquired), right foot: Secondary | ICD-10-CM

## 2014-05-25 DIAGNOSIS — M21611 Bunion of right foot: Secondary | ICD-10-CM

## 2014-05-25 DIAGNOSIS — M21619 Bunion of unspecified foot: Secondary | ICD-10-CM

## 2014-05-25 DIAGNOSIS — M2011 Hallux valgus (acquired), right foot: Secondary | ICD-10-CM

## 2014-05-25 DIAGNOSIS — M2040 Other hammer toe(s) (acquired), unspecified foot: Secondary | ICD-10-CM

## 2014-05-25 DIAGNOSIS — M21621 Bunionette of right foot: Secondary | ICD-10-CM

## 2014-05-25 DIAGNOSIS — M2010 Hallux valgus (acquired), unspecified foot: Secondary | ICD-10-CM

## 2014-05-25 NOTE — Progress Notes (Signed)
   Subjective:    Patient ID: Brenda Patton, female    DOB: [birth year], 38 y.o.   MRN: [id number]  HPI Comments: Pt presents for POV1 05/17/2014 - right bunionectomy, 2,3,4 hammer toe repair and 5th metatarsal osteotomy.  Pt states she is okay and has some stiffness, but can move her toes.     Review of Systems no new findings or systemic changes noted     Objective:   Physical Exam Neurovascular status is intact pedal pulses palpable patient is a day status post Austin bunionectomy right foot as well as hammertoe repair to 3 and 4 and tailor bunionectomy ostectomy fifth metatarsal right foot mild edema and ecchymosis consistent with postop course incisions clean dry well coapted dressings intact and dry the patient is ambulating the air fracture boot as instructed using one crutch for assistance. X-rays revealed good position and alignment of the osteotomy and fixations the second pin is slightly you push did however is not interacting with metatarsal head in is left alone at this time patient dry sterile compressive dressing was reapplied patient is instructed in maintaining ice and elevation possible. For pain management we'll switch from oxycodone to tramadol which was prescribed at issue to her last week.       Assessment & Plan:  Assessment good postop progress wound is well coapted maintain air fracture walker moderate walking activities 5-10 minutes per hour up to 15 minutes per hour the next week reappointed one week plan for suture removal at that time.  Harriet Masson DPM

## 2014-05-25 NOTE — Patient Instructions (Signed)
ICE INSTRUCTIONS  Apply ice or cold pack to the affected area at least 3 times a day for 10-15 minutes each time.  You should also use ice after prolonged activity or vigorous exercise.  Do not apply ice longer than 20 minutes at one time.  Always keep a cloth between your skin and the ice pack to prevent burns.  Being consistent and following these instructions will help control your symptoms.  We suggest you purchase a gel ice pack because they are reusable and do bit leak.  Some of them are designed to wrap around the area.  Use the method that works best for you.  Here are some other suggestions for icing.   Use a frozen bag of peas or corn-inexpensive and molds well to your body, usually stays frozen for 10 to 20 minutes.  Wet a towel with cold water and squeeze out the excess until it's damp.  Place in a bag in the freezer for 20 minutes. Then remove and use.  Elevate the foot whenever possible. Next  As far as pain management goes after the oxycodone has used up switch over to the tramadol prescription which was given take 1 or 2 tramadol every 6 hours as needed for pain

## 2014-06-01 ENCOUNTER — Ambulatory Visit (INDEPENDENT_AMBULATORY_CARE_PROVIDER_SITE_OTHER): Payer: No Typology Code available for payment source

## 2014-06-01 VITALS — BP 103/62 | HR 68 | Resp 16

## 2014-06-01 DIAGNOSIS — M2041 Other hammer toe(s) (acquired), right foot: Secondary | ICD-10-CM

## 2014-06-01 DIAGNOSIS — R609 Edema, unspecified: Secondary | ICD-10-CM

## 2014-06-01 DIAGNOSIS — M2011 Hallux valgus (acquired), right foot: Secondary | ICD-10-CM

## 2014-06-01 DIAGNOSIS — M2040 Other hammer toe(s) (acquired), unspecified foot: Secondary | ICD-10-CM

## 2014-06-01 DIAGNOSIS — M2010 Hallux valgus (acquired), unspecified foot: Secondary | ICD-10-CM

## 2014-06-01 DIAGNOSIS — Z09 Encounter for follow-up examination after completed treatment for conditions other than malignant neoplasm: Secondary | ICD-10-CM

## 2014-06-01 NOTE — Progress Notes (Signed)
   Subjective:    Patient ID: Brenda Patton, female    DOB: [birth year], 38 y.o.   MRN: [id number]  HPI Comments: dos 6.22.15 hes gonna take out the stitches. i have all the feeling back in all my toes and its hurting-rt     Review of Systems no new findings or systemic changes noted     Objective:   Physical Exam Patient presents 2 weeks postop Austin bunionectomy hammertoe repair second third and fourth toes and as well as tailor bunionectomy of the right foot. Incisions clean dry well coapted slight ecchymosis and slight edema is noted consistent with postop course patient had resumption of sensation although still some numbness and abnormal feeling to the second toe according to the patient has some pain aching or throbbing consistent with postop course and edema keep elevated and use ice whenever possible. At this time dressings are removed patient is placed in the Coflex wrap in the digits and ankle is dispensed to maintain compression the forefoot. The wounds are well coapted no dehiscence no discharge no drainage no increased temperature no signs of cellulitis patient is concerned appear infected because there is dark in discoloration the incision area this is actually due to the Inc. for markings and as well as ecchymosis and edema from the surgery. This time patient advised to resume bathing normal hygiene Neosporin cocoa butter to the incision and pin sites       Assessment & Plan:  Assessment good postop progress patient may return to her sitdown light duty work starting the next week we'll furnish provide patient with handicap parking permit if needed patient reappointed in 4 weeks for followup plan for pin removal at that time with x-rays. Patient also asked about having her contralateral foot surgery done earlier some in the next couple weeks so she would have to miss as much work advised that is not acceptable not option patient is advised that we need to wait 3 months before  the contralateral foot surgery was done to allow adequate healing for the current foot. Recommended Advil or Tylenol feet for pain ice and elevation when possible Coflex wrap in is dispensed for used to compression the toes and ankle is dispensed and applied. Recheck in 4 weeks for followup  Harriet Masson DPM

## 2014-06-01 NOTE — Patient Instructions (Signed)

## 2014-06-14 ENCOUNTER — Telehealth: Payer: Self-pay | Admitting: *Deleted

## 2014-06-14 MED ORDER — IBUPROFEN 600 MG PO TABS: 600.0000 mg | ORAL_TABLET | Freq: Four times a day (QID) | ORAL | Status: DC | PRN

## 2014-06-14 NOTE — Telephone Encounter (Signed)
I have a question.  I had plastic surgery on my foot.  I have a question about the pin in my foot.  Someone give me a call.  I know it's late in the day.  Is it normal for the pins to turn?  I try to turn it back.  I don't know if I should.  My foot hurts so bad.  Someone please give be a call back.  I returned her call.  I informed her that the pins can turn.  She said when she tries to turn it back, it hurts.  I told her to leave them alone.  She said she hit one of the toes while she was in the shower.  She stated now the toe is swollen.  She asked if this was normal.  I told her swelling is normal even if pins weren't present.  I asked her if she knocked the pin inward.  She stated no.  She said she needs a refill for the Ibuprofen, didn't get the Tramadol filled.  I told her I would have to ask Dr. Blenda Mounts and see what he says.  She stated she is going back to work is that okay.  I asked her if she has a standing job.  She stated no.  I told her it should be okay as long as she can elevate.  She stated she could.

## 2014-06-14 NOTE — Telephone Encounter (Signed)
I called and left her a message that I'm gong to send the prescription for Ibuprofen to her pharmacy.  Call if you have any further questions.

## 2014-06-25 ENCOUNTER — Ambulatory Visit (INDEPENDENT_AMBULATORY_CARE_PROVIDER_SITE_OTHER): Payer: No Typology Code available for payment source

## 2014-06-25 VITALS — BP 106/65 | HR 86 | Resp 16

## 2014-06-25 DIAGNOSIS — M2010 Hallux valgus (acquired), unspecified foot: Secondary | ICD-10-CM

## 2014-06-25 DIAGNOSIS — M2040 Other hammer toe(s) (acquired), unspecified foot: Secondary | ICD-10-CM

## 2014-06-25 DIAGNOSIS — M21621 Bunionette of right foot: Secondary | ICD-10-CM

## 2014-06-25 DIAGNOSIS — M2011 Hallux valgus (acquired), right foot: Secondary | ICD-10-CM

## 2014-06-25 DIAGNOSIS — Z09 Encounter for follow-up examination after completed treatment for conditions other than malignant neoplasm: Secondary | ICD-10-CM

## 2014-06-25 DIAGNOSIS — M21619 Bunion of unspecified foot: Secondary | ICD-10-CM

## 2014-06-25 DIAGNOSIS — M2041 Other hammer toe(s) (acquired), right foot: Secondary | ICD-10-CM

## 2014-06-25 NOTE — Patient Instructions (Addendum)
ICE INSTRUCTIONS  Apply ice or cold pack to the affected area at least 3 times a day for 10-15 minutes each time.  You should also use ice after prolonged activity or vigorous exercise.  Do not apply ice longer than 20 minutes at one time.  Always keep a cloth between your skin and the ice pack to prevent burns.  Being consistent and following these instructions will help control your symptoms.  We suggest you purchase a gel ice pack because they are reusable and do bit leak.  Some of them are designed to wrap around the area.  Use the method that works best for you.  Here are some other suggestions for icing.   Use a frozen bag of peas or corn-inexpensive and molds well to your body, usually stays frozen for 10 to 20 minutes.  Wet a towel with cold water and squeeze out the excess until it's damp.  Place in a bag in the freezer for 20 minutes. Then remove and use.  Important instructions for exercising the great toe joint. Needed do [name] 100 great toe joint pushups a day in divided into 3 or 4 sessions. Firmly grasp the foot and great toe joint up and down 25x4 times a day. Maintain the blue Coflex wrap for compression on toes and maintain the Ace anklet to keep no swelling of the foot continued use cocoa butter or Neosporin on the incision area.  Need to maintain a lace up athletic or walking shoe or or a cloth type shoe with a thick soled at all times. Do not walk barefoot cannot walk in flip-flops, need a thick soled stable shoe at all times.

## 2014-06-25 NOTE — Progress Notes (Signed)
   Subjective:    Patient ID: Brenda Patton, female    DOB: December 22, 1975, 38 y.o.   MRN: [id number]  HPI Comments: "Doing good. Ready for the pins to come out"  DOS 05-17-2014 POV Austin Bunionectomy, Tailor's Bunionectomy and HT 2nd, 3rd, 4th toes right      Review of Systems no new findings or systemic changes noted     Objective:   Physical Exam Lower extremity vascular status is intact pedal pulses are palpable DP and PT posterior were for K wires intact x-rays reveal good alignment of the digits hallux is rectus with internal K wire fixation stable patient however has not been exercising her great toe at all she has tried to wiggle her toes little bit with the boot on again I demonstrated the appropriate way to move the toe manually dorsiflexing and plantar flexing of the great toe joint patient advised to 100 pushups a day at this time. At this time the distal pin sites are prepped with Betadine and Neosporin and the K wires are retrograded from the site with minimal discomfort. Should note patient has extreme anxiety and very low pain tolerance. It is stressed the patient is she is to continue with her active and passive range of motion exercises maintain Coflex wrap in in anklet to keep the compression and prevent edema. Patient is advised the swelling in the feet and toes last 3 months to 6 month       Assessment & Plan:  Assessment good postop progress with good alignment tailor's are removed at this time from the second third and fourth toes the toes are buddy wrap with Coflex wrap and Neosporin Band-Aid dressings applied to the pin sites. May discontinue air fracture boot and resume walking tennis or athletic shoe or Oxford type shoe or clawing moderate activities over stressed the importance of range of motion recommended ibuprofen or Tylenol as needed for pain reappointed in one month for followup x-ray. Advised that if not able to comply with the range of motion exercises we  need to send her to physical therapy to help with those movements and exercise.  Harriet Masson DPM

## 2014-08-10 ENCOUNTER — Ambulatory Visit (INDEPENDENT_AMBULATORY_CARE_PROVIDER_SITE_OTHER): Payer: No Typology Code available for payment source

## 2014-08-10 VITALS — BP 119/71 | HR 67 | Resp 13 | Ht 62.5 in | Wt 178.0 lb

## 2014-08-10 DIAGNOSIS — M21611 Bunion of right foot: Secondary | ICD-10-CM

## 2014-08-10 DIAGNOSIS — M21621 Bunionette of right foot: Secondary | ICD-10-CM

## 2014-08-10 DIAGNOSIS — M2010 Hallux valgus (acquired), unspecified foot: Secondary | ICD-10-CM

## 2014-08-10 DIAGNOSIS — M2041 Other hammer toe(s) (acquired), right foot: Secondary | ICD-10-CM

## 2014-08-10 DIAGNOSIS — Z09 Encounter for follow-up examination after completed treatment for conditions other than malignant neoplasm: Secondary | ICD-10-CM

## 2014-08-10 DIAGNOSIS — M2011 Hallux valgus (acquired), right foot: Secondary | ICD-10-CM

## 2014-08-10 DIAGNOSIS — M2040 Other hammer toe(s) (acquired), unspecified foot: Secondary | ICD-10-CM

## 2014-08-10 DIAGNOSIS — M21619 Bunion of unspecified foot: Secondary | ICD-10-CM

## 2014-08-10 NOTE — Progress Notes (Signed)
   Subjective:    Patient ID: Brenda Patton, female    DOB: 06/30/1976, 38 y.o.   MRN: [id number]  HPI Comments: DOS 05/17/2014 Right austin bunionectomy, tailors bunionectomy, and 2, 3, 4th hammer toe repair.  Pt asked when and if she was going to be able to exercise, and was concerned she couldn't bend her toes.     Review of Systems no new findings or systemic changes noted    Objective:   Physical Exam Lower extremity objective findings as follows patient is 3 months status post Austin bunionectomy as well as hammertoe repair to 3 and 4 and he'll bunionectomy of the right foot. There is improved range of motion of the hallux as compared to previous visit her patient still guarded she is afraid to move it too far because of pain or discomfort she think she is bending the pin advised patient that it is nowhere near the joint is no inferior or impinging with the joint and soft tissue swelling and edema which is normal for postop. Patient/needs to be more active and passive range of motion again I stressed possibly physical therapy initially patient declined however after further discussion patient agreed to physical therapy for 3 or 4 weeks patient also asked about removing the pin our advice the pain is not painful no symptomatic not interfering with movement and removing the pin at this time would eliminate or reduce her ability to improve range of motion at the current time should note x-rays reveal good consolidation the osteotomies the digits and first metatarsal K wire is intact nondisplaced good range of motion noted on the lesser digit MTP joints adequate resection of bone fifth metatarsal no incisions are well coapted slight pigment changes to the incisions and some scarring although is improving patient advised to continue with topical cocoa butter and scar creams such as mederma      Assessment & Plan:  Assessment good postop progress the osteotomies are well consolidated range of  motion is improving although patient still guarding and anxious about moving the toe at this time we'll arrange physical therapy 3 times a week for 3-4 weeks to do range of motion exercises possible Ultracet and to break the adhesions of the first MTP joint patient will followup in the next 23 months for reevaluation stressed the importance of continued active passive range of motion exercises is free to return to all activities walking and exercise we'll still or athletic shoe for work for another 3 months per patient request. Should be able to resume wearing dress shoes other activities although with lack of motion at the MTP joint still not able to wear high heel. Reevaluate in 3 months after physical therapies been completed  Harriet Masson DPM

## 2014-08-10 NOTE — Patient Instructions (Signed)
ICE INSTRUCTIONS  Apply ice or cold pack to the affected area at least 3 times a day for 10-15 minutes each time.  You should also use ice after prolonged activity or vigorous exercise.  Do not apply ice longer than 20 minutes at one time.  Always keep a cloth between your skin and the ice pack to prevent burns.  Being consistent and following these instructions will help control your symptoms.  We suggest you purchase a gel ice pack because they are reusable and do bit leak.  Some of them are designed to wrap around the area.  Use the method that works best for you.  Here are some other suggestions for icing.   Use a frozen bag of peas or corn-inexpensive and molds well to your body, usually stays frozen for 10 to 20 minutes.  Wet a towel with cold water and squeeze out the excess until it's damp.  Place in a bag in the freezer for 20 minutes. Then remove and use.  Continue with aggressive range of motion exercises. Also continue with cocoa butter to the incision areas.

## 2014-11-09 ENCOUNTER — Ambulatory Visit: Payer: No Typology Code available for payment source

## 2015-10-10 ENCOUNTER — Institutional Professional Consult (permissible substitution): Payer: Self-pay | Admitting: Family Medicine

## 2015-10-12 ENCOUNTER — Institutional Professional Consult (permissible substitution): Payer: Self-pay | Admitting: Medical

## 2016-04-06 ENCOUNTER — Encounter (HOSPITAL_COMMUNITY): Payer: Self-pay

## 2016-04-06 ENCOUNTER — Emergency Department (HOSPITAL_COMMUNITY): Payer: 59

## 2016-04-06 ENCOUNTER — Emergency Department (HOSPITAL_COMMUNITY)
Admission: EM | Admit: 2016-04-06 | Discharge: 2016-04-06 | Disposition: A | Discharge status: Home / Self Care | Payer: 59 | Attending: Emergency Medicine | Admitting: Emergency Medicine

## 2016-04-06 DIAGNOSIS — Z79899 Other long term (current) drug therapy: Secondary | ICD-10-CM | POA: Diagnosis not present

## 2016-04-06 DIAGNOSIS — S8991XA Unspecified injury of right lower leg, initial encounter: Secondary | ICD-10-CM | POA: Diagnosis not present

## 2016-04-06 DIAGNOSIS — S52292A Other fracture of shaft of left ulna, initial encounter for closed fracture: Secondary | ICD-10-CM | POA: Insufficient documentation

## 2016-04-06 DIAGNOSIS — Z792 Long term (current) use of antibiotics: Secondary | ICD-10-CM | POA: Insufficient documentation

## 2016-04-06 DIAGNOSIS — Y9389 Activity, other specified: Secondary | ICD-10-CM | POA: Diagnosis not present

## 2016-04-06 DIAGNOSIS — Z880 Allergy status to penicillin: Secondary | ICD-10-CM | POA: Insufficient documentation

## 2016-04-06 DIAGNOSIS — Y92410 Unspecified street and highway as the place of occurrence of the external cause: Secondary | ICD-10-CM | POA: Insufficient documentation

## 2016-04-06 DIAGNOSIS — F419 Anxiety disorder, unspecified: Secondary | ICD-10-CM | POA: Insufficient documentation

## 2016-04-06 DIAGNOSIS — S52202A Unspecified fracture of shaft of left ulna, initial encounter for closed fracture: Secondary | ICD-10-CM

## 2016-04-06 DIAGNOSIS — Z91040 Latex allergy status: Secondary | ICD-10-CM | POA: Diagnosis not present

## 2016-04-06 DIAGNOSIS — G43909 Migraine, unspecified, not intractable, without status migrainosus: Secondary | ICD-10-CM | POA: Diagnosis not present

## 2016-04-06 DIAGNOSIS — Y998 Other external cause status: Secondary | ICD-10-CM | POA: Diagnosis not present

## 2016-04-06 DIAGNOSIS — S59912A Unspecified injury of left forearm, initial encounter: Secondary | ICD-10-CM | POA: Diagnosis present

## 2016-04-06 MED ORDER — OXYCODONE-ACETAMINOPHEN 5-325 MG PO TABS
2.0000 | ORAL_TABLET | Freq: Once | ORAL | Status: AC
  Administered 2016-04-06: 2 via ORAL

## 2016-04-06 MED ORDER — OXYCODONE-ACETAMINOPHEN 5-325 MG PO TABS: 1.0000 | ORAL_TABLET | ORAL | Status: DC | PRN

## 2016-04-06 MED ORDER — OXYCODONE-ACETAMINOPHEN 5-325 MG PO TABS
2.0000 | ORAL_TABLET | Freq: Once | ORAL | Status: DC
  Filled 2016-04-06: qty 2

## 2016-04-06 NOTE — ED Notes (Signed)
Pt wants to wait until her  Splint is placed

## 2016-04-06 NOTE — Discharge Instructions (Signed)
Take the prescribed medication as directed.  Use caution, can make you sleepy/drowsy. Follow-up with Dr. Burney Gauze-- call his office to schedule appt. Return to the ED for new or worsening symptoms.

## 2016-04-06 NOTE — ED Notes (Signed)
Ortho tech coming 
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
## 2016-04-06 NOTE — Progress Notes (Signed)
Orthopedic Tech Progress Note Patient Details:  Brenda Patton 04/12/1976 [id number] Applied fiberglass sugar tong splint to LUE.  Pulses, sensation, motion intact before and after splinting.  Capillary refill less than 2 seconds before ans after splinting.  Placed splinted LUE in arm sling. Ortho Devices Type of Ortho Device: Sugartong splint, Arm sling Ortho Device/Splint Location: LUE Ortho Device/Splint Interventions: Application   Darrol Poke 04/06/2016, 8:15 PM

## 2016-04-06 NOTE — ED Notes (Signed)
The pt has decided now to take the percocet

## 2016-04-06 NOTE — ED Notes (Signed)
See triage rn and pas notes

## 2016-04-06 NOTE — ED Notes (Signed)
The pt was offered percocet for pain she refused it  She reports that it does not go down well
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
## 2016-04-08 ENCOUNTER — Ambulatory Visit (INDEPENDENT_AMBULATORY_CARE_PROVIDER_SITE_OTHER): Payer: No Typology Code available for payment source

## 2016-04-08 ENCOUNTER — Encounter (HOSPITAL_COMMUNITY): Payer: Self-pay | Admitting: Emergency Medicine

## 2016-04-08 ENCOUNTER — Ambulatory Visit (HOSPITAL_COMMUNITY)
Admission: EM | Admit: 2016-04-08 | Discharge: 2016-04-08 | Disposition: A | Discharge status: Home / Self Care | Payer: No Typology Code available for payment source | Attending: Emergency Medicine | Admitting: Emergency Medicine

## 2016-04-08 DIAGNOSIS — M25512 Pain in left shoulder: Secondary | ICD-10-CM

## 2016-04-08 DIAGNOSIS — M542 Cervicalgia: Secondary | ICD-10-CM
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
## 2016-04-09 ENCOUNTER — Telehealth (HOSPITAL_BASED_OUTPATIENT_CLINIC_OR_DEPARTMENT_OTHER): Payer: Self-pay | Admitting: Emergency Medicine

## 2016-04-18 ENCOUNTER — Other Ambulatory Visit: Payer: Self-pay | Admitting: Orthopedic Surgery

## 2016-04-18 ENCOUNTER — Encounter (HOSPITAL_BASED_OUTPATIENT_CLINIC_OR_DEPARTMENT_OTHER): Payer: Self-pay | Admitting: *Deleted

## 2016-04-20 ENCOUNTER — Encounter (HOSPITAL_BASED_OUTPATIENT_CLINIC_OR_DEPARTMENT_OTHER): Payer: Self-pay | Admitting: *Deleted

## 2016-04-20 ENCOUNTER — Ambulatory Visit (HOSPITAL_BASED_OUTPATIENT_CLINIC_OR_DEPARTMENT_OTHER)
Admission: RE | Admit: 2016-04-20 | Discharge: 2016-04-20 | Disposition: A | Discharge status: Home / Self Care | Payer: 59 | Source: Ambulatory Visit | Attending: Orthopedic Surgery | Admitting: Orthopedic Surgery

## 2016-04-20 ENCOUNTER — Encounter (HOSPITAL_BASED_OUTPATIENT_CLINIC_OR_DEPARTMENT_OTHER)
Admission: RE | Disposition: A | Discharge status: Home / Self Care | Payer: Self-pay | Source: Ambulatory Visit | Attending: Orthopedic Surgery

## 2016-04-20 ENCOUNTER — Ambulatory Visit (HOSPITAL_BASED_OUTPATIENT_CLINIC_OR_DEPARTMENT_OTHER): Payer: 59 | Admitting: Anesthesiology

## 2016-04-20 DIAGNOSIS — Z79899 Other long term (current) drug therapy: Secondary | ICD-10-CM | POA: Insufficient documentation

## 2016-04-20 DIAGNOSIS — K219 Gastro-esophageal reflux disease without esophagitis: Secondary | ICD-10-CM | POA: Diagnosis not present

## 2016-04-20 DIAGNOSIS — S52202A Unspecified fracture of shaft of left ulna, initial encounter for closed fracture: Secondary | ICD-10-CM | POA: Insufficient documentation

## 2016-04-20 DIAGNOSIS — X58XXXA Exposure to other specified factors, initial encounter: Secondary | ICD-10-CM | POA: Insufficient documentation

## 2016-04-20 HISTORY — PX: ORIF ULNAR FRACTURE: SHX5417

## 2016-04-20 HISTORY — DX: Gastro-esophageal reflux disease without esophagitis: K21.9

## 2016-04-20 SURGERY — OPEN REDUCTION INTERNAL FIXATION (ORIF) ULNAR FRACTURE
Anesthesia: Regional | Site: Arm Lower | Laterality: Left

## 2016-04-20 MED ORDER — MIDAZOLAM HCL 2 MG/2ML IJ SOLN
INTRAMUSCULAR | Status: AC
  Filled 2016-04-20: qty 2

## 2016-04-20 MED ORDER — GLYCOPYRROLATE 0.2 MG/ML IJ SOLN: 0.2000 mg | Freq: Once | INTRAMUSCULAR | Status: DC | PRN

## 2016-04-20 MED ORDER — PHENYLEPHRINE HCL 10 MG/ML IJ SOLN
INTRAMUSCULAR | Status: DC | PRN
  Administered 2016-04-20: 80 ug via INTRAVENOUS

## 2016-04-20 MED ORDER — MEPERIDINE HCL 25 MG/ML IJ SOLN: 6.2500 mg | INTRAMUSCULAR | Status: DC | PRN

## 2016-04-20 MED ORDER — VANCOMYCIN HCL IN DEXTROSE 1-5 GM/200ML-% IV SOLN
INTRAVENOUS | Status: AC
  Filled 2016-04-20: qty 200

## 2016-04-20 MED ORDER — MIDAZOLAM HCL 2 MG/2ML IJ SOLN
1.0000 mg | INTRAMUSCULAR | Status: DC | PRN
  Administered 2016-04-20 (×2): 2 mg via INTRAVENOUS

## 2016-04-20 MED ORDER — PROPOFOL 10 MG/ML IV BOLUS
INTRAVENOUS | Status: AC
  Filled 2016-04-20: qty 20

## 2016-04-20 MED ORDER — HYDROMORPHONE HCL 1 MG/ML IJ SOLN: 0.2500 mg | INTRAMUSCULAR | Status: DC | PRN

## 2016-04-20 MED ORDER — PROPOFOL 500 MG/50ML IV EMUL
INTRAVENOUS | Status: DC | PRN
  Administered 2016-04-20: 150 ug/kg/min via INTRAVENOUS

## 2016-04-20 MED ORDER — VANCOMYCIN HCL IN DEXTROSE 1-5 GM/200ML-% IV SOLN
1000.0000 mg | INTRAVENOUS | Status: AC
  Administered 2016-04-20: 1000 mg via INTRAVENOUS

## 2016-04-20 MED ORDER — LACTATED RINGERS IV SOLN
INTRAVENOUS | Status: DC
  Administered 2016-04-20 (×2): via INTRAVENOUS

## 2016-04-20 MED ORDER — BUPIVACAINE HCL (PF) 0.25 % IJ SOLN
INTRAMUSCULAR | Status: AC
  Filled 2016-04-20: qty 30

## 2016-04-20 MED ORDER — OXYCODONE-ACETAMINOPHEN 5-325 MG PO TABS: 1.0000 | ORAL_TABLET | ORAL | Status: DC | PRN

## 2016-04-20 MED ORDER — ONDANSETRON HCL 4 MG/2ML IJ SOLN: 4.0000 mg | Freq: Once | INTRAMUSCULAR | Status: DC | PRN

## 2016-04-20 MED ORDER — PROPOFOL 10 MG/ML IV BOLUS
INTRAVENOUS | Status: DC | PRN
  Administered 2016-04-20 (×3): 50 mg via INTRAVENOUS

## 2016-04-20 MED ORDER — FENTANYL CITRATE (PF) 100 MCG/2ML IJ SOLN
50.0000 ug | INTRAMUSCULAR | Status: DC | PRN
  Administered 2016-04-20: 100 ug via INTRAVENOUS

## 2016-04-20 MED ORDER — CHLORHEXIDINE GLUCONATE 4 % EX LIQD: 60.0000 mL | Freq: Once | CUTANEOUS | Status: DC

## 2016-04-20 MED ORDER — FENTANYL CITRATE (PF) 100 MCG/2ML IJ SOLN
INTRAMUSCULAR | Status: AC
  Filled 2016-04-20: qty 2

## 2016-04-20 MED ORDER — SCOPOLAMINE 1 MG/3DAYS TD PT72: 1.0000 | MEDICATED_PATCH | Freq: Once | TRANSDERMAL | Status: DC | PRN

## 2016-04-20 SURGICAL SUPPLY — 74 items
APL SKNCLS STERI-STRIP NONHPOA (GAUZE/BANDAGES/DRESSINGS)
BAG DECANTER FOR FLEXI CONT (MISCELLANEOUS) IMPLANT
BANDAGE ACE 3X5.8 VEL STRL LF (GAUZE/BANDAGES/DRESSINGS) IMPLANT
BANDAGE ACE 4X5 VEL STRL LF (GAUZE/BANDAGES/DRESSINGS) ×2 IMPLANT
BENZOIN TINCTURE PRP APPL 2/3 (GAUZE/BANDAGES/DRESSINGS) IMPLANT
BIT DRILL 2.5X2.75 QC CALB (BIT) ×2 IMPLANT
BLADE MINI RND TIP GREEN BEAV (BLADE) IMPLANT
BLADE SURG 15 STRL LF DISP TIS (BLADE) ×2 IMPLANT
BLADE SURG 15 STRL SS (BLADE) ×4
BNDG CMPR 9X4 STRL LF SNTH (GAUZE/BANDAGES/DRESSINGS) ×1
BNDG COHESIVE 4X5 TAN STRL (GAUZE/BANDAGES/DRESSINGS) IMPLANT
BNDG ESMARK 4X9 LF (GAUZE/BANDAGES/DRESSINGS) ×2 IMPLANT
BNDG GAUZE ELAST 4 BULKY (GAUZE/BANDAGES/DRESSINGS) ×2 IMPLANT
CANISTER SUCT 1200ML W/VALVE (MISCELLANEOUS) IMPLANT
CORDS BIPOLAR (ELECTRODE) ×2 IMPLANT
COVER BACK TABLE 60X90IN (DRAPES) ×2 IMPLANT
CUFF TOURN SGL LL 18 NRW (TOURNIQUET CUFF) IMPLANT
CUFF TOURNIQUET SINGLE 18IN (TOURNIQUET CUFF) IMPLANT
DECANTER SPIKE VIAL GLASS SM (MISCELLANEOUS) IMPLANT
DRAPE EXTREMITY T 121X128X90 (DRAPE) ×2 IMPLANT
DRAPE OEC MINIVIEW 54X84 (DRAPES) ×2 IMPLANT
DRAPE SURG 17X23 STRL (DRAPES) ×2 IMPLANT
DURAPREP 26ML APPLICATOR (WOUND CARE) ×2 IMPLANT
ELECT REM PT RETURN 9FT ADLT (ELECTROSURGICAL)
ELECTRODE REM PT RTRN 9FT ADLT (ELECTROSURGICAL) IMPLANT
GAUZE SPONGE 4X4 12PLY STRL (GAUZE/BANDAGES/DRESSINGS) ×2 IMPLANT
GAUZE SPONGE 4X4 16PLY XRAY LF (GAUZE/BANDAGES/DRESSINGS) IMPLANT
GAUZE XEROFORM 1X8 LF (GAUZE/BANDAGES/DRESSINGS) IMPLANT
GLOVE SURG SYN 8.0 (GLOVE) ×4 IMPLANT
GOWN STRL REUS W/ TWL LRG LVL3 (GOWN DISPOSABLE) ×1 IMPLANT
GOWN STRL REUS W/TWL LRG LVL3 (GOWN DISPOSABLE) ×2
GOWN STRL REUS W/TWL XL LVL3 (GOWN DISPOSABLE) ×4 IMPLANT
NEEDLE HYPO 25X1 1.5 SAFETY (NEEDLE) IMPLANT
NS IRRIG 1000ML POUR BTL (IV SOLUTION) ×2 IMPLANT
PACK BASIN DAY SURGERY FS (CUSTOM PROCEDURE TRAY) ×2 IMPLANT
PAD CAST 3X4 CTTN HI CHSV (CAST SUPPLIES) ×1 IMPLANT
PAD CAST 4YDX4 CTTN HI CHSV (CAST SUPPLIES) IMPLANT
PADDING CAST ABS 4INX4YD NS (CAST SUPPLIES) ×1
PADDING CAST ABS COTTON 4X4 ST (CAST SUPPLIES) ×1 IMPLANT
PADDING CAST COTTON 3X4 STRL (CAST SUPPLIES) ×2
PADDING CAST COTTON 4X4 STRL (CAST SUPPLIES)
PENCIL BUTTON HOLSTER BLD 10FT (ELECTRODE) IMPLANT
PLATE LOCK COMP 5H FOOT (Plate) ×2 IMPLANT
SCREW CORTICAL 3.5MM  16MM (Screw) ×4 IMPLANT
SCREW CORTICAL 3.5MM 14MM (Screw) ×2 IMPLANT
SCREW CORTICAL 3.5MM 16MM (Screw) ×4 IMPLANT
SHEET MEDIUM DRAPE 40X70 STRL (DRAPES) ×2 IMPLANT
SLING ARM FOAM STRAP LRG (SOFTGOODS) IMPLANT
SPLINT FAST PLASTER 5X30 (CAST SUPPLIES)
SPLINT PLASTER CAST FAST 5X30 (CAST SUPPLIES) IMPLANT
SPLINT PLASTER CAST XFAST 3X15 (CAST SUPPLIES) IMPLANT
SPLINT PLASTER CAST XFAST 4X15 (CAST SUPPLIES) IMPLANT
SPLINT PLASTER XTRA FAST SET 4 (CAST SUPPLIES)
SPLINT PLASTER XTRA FASTSET 3X (CAST SUPPLIES)
STAPLER VISISTAT 35W (STAPLE) IMPLANT
STOCKINETTE 4X48 STRL (DRAPES) ×2 IMPLANT
STRIP CLOSURE SKIN 1/2X4 (GAUZE/BANDAGES/DRESSINGS) IMPLANT
SUCTION FRAZIER HANDLE 10FR (MISCELLANEOUS)
SUCTION TUBE FRAZIER 10FR DISP (MISCELLANEOUS) IMPLANT
SUT ETHILON 4 0 PS 2 18 (SUTURE) IMPLANT
SUT MERSILENE 4 0 P 3 (SUTURE) IMPLANT
SUT PROLENE 3 0 PS 2 (SUTURE) IMPLANT
SUT SILK 2 0 FS (SUTURE) IMPLANT
SUT VIC AB 0 SH 27 (SUTURE) IMPLANT
SUT VIC AB 2-0 SH 27 (SUTURE)
SUT VIC AB 2-0 SH 27XBRD (SUTURE) IMPLANT
SUT VIC AB 3-0 FS2 27 (SUTURE) IMPLANT
SUT VICRYL RAPIDE 4-0 (SUTURE) IMPLANT
SUT VICRYL RAPIDE 4/0 PS 2 (SUTURE) IMPLANT
SYR BULB 3OZ (MISCELLANEOUS) ×2 IMPLANT
SYRINGE 10CC LL (SYRINGE) IMPLANT
TOWEL OR 17X24 6PK STRL BLUE (TOWEL DISPOSABLE) ×2 IMPLANT
TUBE CONNECTING 20X1/4 (TUBING) IMPLANT
UNDERPAD 30X30 (UNDERPADS AND DIAPERS) ×2 IMPLANT

## 2016-04-20 NOTE — Anesthesia Preprocedure Evaluation (Signed)
Anesthesia Evaluation  Patient identified by MRN, date of birth, ID band Patient awake    Reviewed: Allergy & Precautions, NPO status , Patient's Chart, lab work & pertinent test results  Airway Mallampati: I  TM Distance: >3 FB Neck ROM: Full    Dental   Pulmonary    Pulmonary exam normal        Cardiovascular Normal cardiovascular exam     Neuro/Psych Anxiety    GI/Hepatic GERD  Medicated and Controlled,  Endo/Other    Renal/GU      Musculoskeletal   Abdominal   Peds  Hematology   Anesthesia Other Findings   Reproductive/Obstetrics                             Anesthesia Physical Anesthesia Plan  ASA: II  Anesthesia Plan: Regional   Post-op Pain Management:    Induction: Intravenous  Airway Management Planned: Simple Face Mask  Additional Equipment:   Intra-op Plan:   Post-operative Plan:   Informed Consent: I have reviewed the patients History and Physical, chart, labs and discussed the procedure including the risks, benefits and alternatives for the proposed anesthesia with the patient or authorized representative who has indicated his/her understanding and acceptance.     Plan Discussed with: CRNA and Surgeon  Anesthesia Plan Comments:         Anesthesia Quick Evaluation

## 2016-04-20 NOTE — Anesthesia Postprocedure Evaluation (Signed)
Anesthesia Post Note  Patient: Brenda Patton  Procedure(s) Performed: Procedure(s) (LRB): OPEN REDUCTION INTERNAL FIXATION (ORIF) LEFT ULNAR FRACTURE (Left)  Patient location during evaluation: PACU Anesthesia Type: Regional Level of consciousness: awake and alert and patient cooperative Pain management: pain level controlled Vital Signs Assessment: post-procedure vital signs reviewed and stable Respiratory status: spontaneous breathing and respiratory function stable Cardiovascular status: stable Anesthetic complications: no    Last Vitals:  Filed Vitals:   04/20/16 1300 04/20/16 1315  BP: 108/84 113/88  Pulse: 72 67  Temp:    Resp: 13 15    Last Pain:  Filed Vitals:   04/20/16 1318  PainSc: 0-No pain                 Umi Mainor DAVID
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
## 2016-04-20 NOTE — Progress Notes (Signed)
Assisted Dr. Ossey with left, ultrasound guided, supraclavicular block. Side rails up, monitors on throughout procedure. See vital signs in flow sheet. Tolerated Procedure well. 

## 2016-04-20 NOTE — Transfer of Care (Signed)
Immediate Anesthesia Transfer of Care Note  Patient: Brenda Patton  Procedure(s) Performed: Procedure(s): OPEN REDUCTION INTERNAL FIXATION (ORIF) LEFT ULNAR FRACTURE (Left)  Patient Location: PACU  Anesthesia Type:MAC combined with regional for post-op pain  Level of Consciousness: awake, alert  and oriented  Airway & Oxygen Therapy: Patient Spontanous Breathing and Patient connected to face mask oxygen  Post-op Assessment: Report given to RN and Post -op Vital signs reviewed and stable  Post vital signs: Reviewed and stable  Last Vitals:  Filed Vitals:   04/20/16 1125 04/20/16 1130  BP: 125/69 123/73  Pulse: 82 79  Temp:    Resp: 19 19    Last Pain:  Filed Vitals:   04/20/16 1132  PainSc: 8       Patients Stated Pain Goal: 5 ([phone])  Complications: No apparent anesthesia complications

## 2016-04-20 NOTE — Discharge Instructions (Signed)
° ° °  Regional Anesthesia Blocks ° °1. Numbness or the inability to move the "blocked" extremity may last from 3-48 hours after placement. The length of time depends on the medication injected and your individual response to the medication. If the numbness is not going away after 48 hours, call your surgeon. ° °2. The extremity that is blocked will need to be protected until the numbness is gone and the  Strength has returned. Because you cannot feel it, you will need to take extra care to avoid injury. Because it may be weak, you may have difficulty moving it or using it. You may not know what position it is in without looking at it while the block is in effect. ° °3. For blocks in the legs and feet, returning to weight bearing and walking needs to be done carefully. You will need to wait until the numbness is entirely gone and the strength has returned. You should be able to move your leg and foot normally before you try and bear weight or walk. You will need someone to be with you when you first try to ensure you do not fall and possibly risk injury. ° °4. Bruising and tenderness at the needle site are common side effects and will resolve in a few days. ° °5. Persistent numbness or new problems with movement should be communicated to the surgeon or the [facility] ([phone])/ [facility] ([phone]). ° ° ° °Post Anesthesia Home Care Instructions ° °Activity: °Get plenty of rest for the remainder of the day. A responsible adult should stay with you for 24 hours following the procedure.  °For the next 24 hours, DO NOT: °-Drive a car °-Operate machinery °-Drink alcoholic beverages °-Take any medication unless instructed by your physician °-Make any legal decisions or sign important papers. ° °Meals: °Start with liquid foods such as gelatin or soup. Progress to regular foods as tolerated. Avoid greasy, spicy, heavy foods. If nausea and/or vomiting occur, drink only clear liquids until  the nausea and/or vomiting subsides. Call your physician if vomiting continues. ° °Special Instructions/Symptoms: °Your throat may feel dry or sore from the anesthesia or the breathing tube placed in your throat during surgery. If this causes discomfort, gargle with warm salt water. The discomfort should disappear within 24 hours. ° °If you had a scopolamine patch placed behind your ear for the management of post- operative nausea and/or vomiting: ° °1. The medication in the patch is effective for 72 hours, after which it should be removed.  Wrap patch in a tissue and discard in the trash. Wash hands thoroughly with soap and water. °2. You may remove the patch earlier than 72 hours if you experience unpleasant side effects which may include dry mouth, dizziness or visual disturbances. °3. Avoid touching the patch. Wash your hands with soap and water after contact with the patch. °  ° °

## 2016-04-20 NOTE — Op Note (Signed)
See note [id number]

## 2016-04-20 NOTE — Anesthesia Procedure Notes (Signed)
Anesthesia Regional Block:  Supraclavicular block  Pre-Anesthetic Checklist: ,, timeout performed, Correct Patient, Correct Site, Correct Laterality, Correct Procedure, Correct Position, site marked, Risks and benefits discussed,  Surgical consent,  Pre-op evaluation,  At surgeon's request and post-op pain management  Laterality: Left  Prep: chloraprep       Needles:   Needle Type: Echogenic Stimulator Needle     Needle Length: 9cm 9 cm Needle Gauge: 21 and 21 G    Additional Needles:  Procedures: ultrasound guided (picture in chart) and nerve stimulator Supraclavicular block  Nerve Stimulator or Paresthesia:  Response: 0.4 mA,   Additional Responses:   Narrative:  Start time: 04/20/2016 10:55 AM End time: 04/20/2016 11:05 AM Injection made incrementally with aspirations every 5 mL.  Performed by: Personally  Anesthesiologist: Lillia Abed  Additional Notes: Monitors applied. Patient sedated. Sterile prep and drape,hand hygiene and sterile gloves were used. Relevant anatomy identified.Needle position confirmed.Local anesthetic injected incrementally after negative aspiration. Local anesthetic spread visualized around nerve(s). Vascular puncture avoided. No complications. Image printed for medical record.The patient tolerated the procedure well.

## 2016-04-23 NOTE — Op Note (Signed)
NAME:  Brenda Patton, Brenda Patton                  ACCOUNT NO.:  MEDICAL RECORD NO.:  [id number]  LOCATION:                                 FACILITY:  PHYSICIAN:  Jakaria Lavergne A. Sheron Tallman, M.D.DATE OF BIRTH:  18-Nov-1976  DATE OF PROCEDURE:  04/20/2016 DATE OF DISCHARGE:                              OPERATIVE REPORT   PREOPERATIVE DIAGNOSIS:  Displaced distal third ulnar shaft fracture, left side.  POSTOPERATIVE DIAGNOSIS:  Displaced distal third ulnar shaft fracture, left side.  PROCEDURE:  Open reduction and internal fixation, left distal third shaft fracture ulna, left side.  SURGEON:  Sheral Apley. Burney Gauze, M.D.  ASSISTANT:  None.  ANESTHESIA:  Axillary block and general.  COMPLICATION:  None.  DRAINS:  None.  DESCRIPTION OF PROCEDURE:  The patient was taken to the operating suite. After induction of adequate general anesthetic, an axillary block analgesia.  Left upper extremity was prepped and draped in sterile fashion.  An Esmarch was used to exsanguinate the limb.  Tourniquet was inflated to 250 mmHg.  At this point in time, incision was made over the subcutaneous border of the distal third of the ulna.  Skin was incised sharply 6-8 cm interval between the extensor carpi ulnaris and flexor carpi ulnaris tendons was identified and split.  We identified the fracture site.  We did a subperiosteal dissection of the fracture site. We then placed a 5-hole plate with 2 cortical screws above and 2 cortical screws below and 1 at the fracture site in lag fashion under direct and fluoroscopic guidance.  Intraoperative fluoroscopy with adequate reduction in AP, lateral, and oblique view.  The wound was irrigated and loosely closed in layers of 2-0 undyed Vicryl and a 3-0 Prolene subcuticular stitch on the skin.  Steri-Strips, 4x4s, fluffs, and ulnar gutter splint were applied.  The patient tolerated the procedure well in concealed stable fashion.     Sheral Apley Burney Gauze,  M.D.     MAW/MEDQ  D:  04/20/2016  T:  04/21/2016  Job:  [id number]

## 2016-04-24 ENCOUNTER — Encounter (HOSPITAL_BASED_OUTPATIENT_CLINIC_OR_DEPARTMENT_OTHER): Payer: Self-pay | Admitting: Orthopedic Surgery

## 2018-12-02 ENCOUNTER — Other Ambulatory Visit: Payer: Self-pay | Admitting: Nurse Practitioner

## 2018-12-02 DIAGNOSIS — Z1231 Encounter for screening mammogram for malignant neoplasm of breast: Secondary | ICD-10-CM

## 2018-12-18 ENCOUNTER — Encounter: Payer: Self-pay | Admitting: Obstetrics and Gynecology

## 2018-12-30 ENCOUNTER — Ambulatory Visit
Admission: RE | Admit: 2018-12-30 | Discharge: 2018-12-30 | Disposition: A | Discharge status: Home / Self Care | Payer: BLUE CROSS/BLUE SHIELD | Source: Ambulatory Visit | Attending: Nurse Practitioner | Admitting: Nurse Practitioner

## 2018-12-30 DIAGNOSIS — Z1231 Encounter for screening mammogram for malignant neoplasm of breast: Secondary | ICD-10-CM

## 2018-12-31 ENCOUNTER — Ambulatory Visit: Payer: 59

## 2019-01-01 ENCOUNTER — Other Ambulatory Visit: Payer: Self-pay | Admitting: Nurse Practitioner

## 2019-01-01 DIAGNOSIS — R928 Other abnormal and inconclusive findings on diagnostic imaging of breast: Secondary | ICD-10-CM

## 2019-01-09 ENCOUNTER — Encounter: Payer: Self-pay | Admitting: Obstetrics and Gynecology

## 2019-01-09 ENCOUNTER — Inpatient Hospital Stay: Admission: RE | Admit: 2019-01-09 | Payer: BLUE CROSS/BLUE SHIELD | Source: Ambulatory Visit

## 2019-01-16 ENCOUNTER — Ambulatory Visit
Admission: RE | Admit: 2019-01-16 | Discharge: 2019-01-16 | Disposition: A | Discharge status: Home / Self Care | Payer: BLUE CROSS/BLUE SHIELD | Source: Ambulatory Visit | Attending: Nurse Practitioner | Admitting: Nurse Practitioner

## 2019-01-16 ENCOUNTER — Ambulatory Visit: Payer: BLUE CROSS/BLUE SHIELD

## 2019-01-16 ENCOUNTER — Other Ambulatory Visit: Payer: Self-pay | Admitting: Nurse Practitioner

## 2019-01-16 DIAGNOSIS — R928 Other abnormal and inconclusive findings on diagnostic imaging of breast: Secondary | ICD-10-CM

## 2019-01-30 ENCOUNTER — Other Ambulatory Visit (HOSPITAL_COMMUNITY)
Admission: RE | Admit: 2019-01-30 | Discharge: 2019-01-30 | Disposition: A | Discharge status: Home / Self Care | Payer: BLUE CROSS/BLUE SHIELD | Source: Ambulatory Visit | Attending: Obstetrics and Gynecology | Admitting: Obstetrics and Gynecology

## 2019-01-30 ENCOUNTER — Ambulatory Visit (INDEPENDENT_AMBULATORY_CARE_PROVIDER_SITE_OTHER): Payer: BLUE CROSS/BLUE SHIELD | Admitting: Obstetrics and Gynecology

## 2019-01-30 ENCOUNTER — Encounter: Payer: Self-pay | Admitting: Obstetrics and Gynecology

## 2019-01-30 ENCOUNTER — Other Ambulatory Visit: Payer: Self-pay

## 2019-01-30 VITALS — BP 100/60 | HR 84 | Resp 16 | Ht 63.0 in | Wt 207.0 lb

## 2019-01-30 DIAGNOSIS — Z124 Encounter for screening for malignant neoplasm of cervix: Secondary | ICD-10-CM | POA: Insufficient documentation

## 2019-01-30 DIAGNOSIS — A630 Anogenital (venereal) warts: Secondary | ICD-10-CM | POA: Insufficient documentation

## 2019-01-30 DIAGNOSIS — R1031 Right lower quadrant pain: Secondary | ICD-10-CM | POA: Diagnosis not present

## 2019-01-30 DIAGNOSIS — R102 Pelvic and perineal pain: Secondary | ICD-10-CM

## 2019-01-30 NOTE — Patient Instructions (Addendum)
Check with your insurance about the gardasil vaccine (HPV)   Human Papillomavirus Human papillomavirus (HPV) is the most common sexually transmitted infection (STI). It easily spreads from person to person (is highly contagious). HPV infections cause genital warts. Certain types of HPV may cause cancers, including cancer of the lower part of the uterus (cervix), vagina, outer female genital area (vulva), penis, anus, and rectum. HPV may also cause cancers of the oral cavity, such as the throat, tongue, and tonsils. There are many types of HPV. It usually does not cause symptoms. However, sometimes there are wart-like lesions in the throat or warts in the genital area that you can see or feel. It is possible to be infected for long periods and pass HPV to others without knowing it. What are the causes? HPV is caused by a virus that spreads from person to person through sexual contact. This includes oral, vaginal, or anal sex. What increases the risk? The following factors may make you more likely to develop this condition:  Having unprotected oral, vaginal, or anal sex.  Having several sex partners.  Having a sex partner who has other sex partners.  Having or having had another STI.  Having a weak disease-fighting (immune) system.  Having damaged skin in the genital area. What are the signs or symptoms? Most people who have HPV do not have any symptoms. If symptoms are present, they may include:  Wartlike lesions in the throat (from having oral sex).  Warts on the infected skin or mucous membranes.  Genital warts that may itch, burn, bleed, or be painful during sexual intercourse. How is this diagnosed? If wartlike lesions are present in the throat or if genital warts are present, your health care provider can usually diagnose HPV with a physical exam. Genital warts are easily seen. In females, tests may be used to diagnose HPV, including:  A Pap test. A Pap test takes a sample of  cells from your cervix to check for cancer and HPV infection.  An HPV test. This is similar to a Pap test and involves taking a sample of cells from your cervix.  Using a scope to view the cervix (colposcopy). This may be done if a pelvic exam or Pap test is abnormal. A sample of tissue may be removed for testing (biopsy) during the colposcopy. Currently, there is no test to detect HPV in males. How is this treated? There is no treatment for the virus itself. However, there are treatments for the health problems and symptoms HPV can cause. Your health care provider will monitor you closely after you are treated as HPV can come back and may need treatment again. Treatment for HPV may include:  Medicines, which may be injected or applied to genital warts in a cream, lotion, liquid or gel form.  Use of a probe to apply extreme cold (cryotherapy) to the genital warts.  Application of an intense beam of light (laser treatment) on the genital warts.  Use of a probe to apply extreme heat (electrocautery) on the genital warts.  Surgery to remove the genital warts. Follow these instructions at home: Medicines  Take over-the-counter and prescription medicines only as told by your health care provider. This include creams for itching or irritation.  Do not treat genital warts with medicines used for treating hand warts. General instructions  Do not touch or scratch the warts.  Do not have sex while you are being treated.  Do not douche or use tampons during treatment (women).  Tell your sex partner about your infection. He or she may also need to be treated.  If you become pregnant, tell your health care provider that you have HPV. Your health care provider will monitor you closely during pregnancy to make sure your baby is safe.  Keep all follow-up visits as told by your health care provider. This is important. How is this prevented?  Talk with your health care provider about getting the  HPV vaccines. These vaccines prevent some HPV infections and cancers. The vaccines are recommended for males and females between the ages of 72 and 78. They will not work if you already have HPV, and they are not recommended for pregnant women.  After treatment, use condoms during sex to prevent future infections.  Have only one sex partner.  Have a sex partner who does not have other sex partners.  Get regular Pap tests as directed by your health care provider. Contact a health care provider if:  The treated skin becomes red, swollen, or painful.  You have a fever.  You feel generally ill.  You feel lumps or pimples sticking out in and around your genital area.  You develop bleeding of the vagina or the treatment area.  You have painful sexual intercourse. Summary  Human papillomavirus (HPV) is the most common sexually transmitted infection (STI) and is highly contagious.  Most people carrying HPV do not have any symptoms.  HPV can be prevented with vaccination. The vaccine is recommended for males and females between the ages of 22 and 46.  There is no treatment for the virus itself. However, there are treatments for the health problems and symptoms HPV can cause. This information is not intended to replace advice given to you by your health care provider. Make sure you discuss any questions you have with your health care provider. Document Released: 02/02/2004 Document Revised: 10/21/2016 Document Reviewed: 10/21/2016 Elsevier Interactive Patient Education  2019 [employer].  HPV Test Why am I having this test? HPV (human papillomavirus) refers to a group of about 100 viruses. Many of these viruses cause growths on, in, or around the genitals. Most HPV viruses cause infections that usually go away without treatment.  The HPV test checks for high-risk types (strains) of HPV. Strains 16 and 18 are considered the most high-risk for cancer. If you have strain 16 or 18 HPV and it  is not treated, it can increase your risk for cancer of the cervix, vagina, vulva, or anus. HPV can be found in both males and females. However, the HPV test is used to screen for increased cancer risk in females:  Who are 100-68 years old.  Who have an abnormal Pap test.  Who have been treated for an abnormal Pap test in the past.  Who have been treated for a high-risk HPV infection in the past. If you are a woman older than 30, you may have the HPV test at the same time as a pelvic exam and Pap test. What is being tested? This test checks for the DNA (genetic) strands of the HPV infection. This test is also called the HPV DNA test. What kind of sample is taken?  This test requires a sample of cells from the cervix. This will be done using a small cotton swab, plastic spatula, or brush. This sample is often collected during a pelvic exam, when you are lying on your back on an exam table with feet in footrests (stirrups). How do I prepare for this test?  Starting 24-48 hours before your test, or as told by your health care provider, do not: ? Take a bath. ? Have sex. ? Douche.  Schedule the test for a day when you are not menstruating. If you are menstruating on the day of the test, you may need to reschedule.  You will be asked to urinate right before the test. How are the results reported? Your test results will be reported as either positive or negative for HPV. If you have a positive result, the results will indicate which HPV strain you are positive for. What do the results mean? A negative HPV test result means that no HPV was found. This means it is very likely that you do not have HPV. A positive HPV test result means that you have HPV.  If your results show the presence of any high-risk strains, you may have a higher risk of developing anal or cervical cancer if your infection is not treated.  If any low-risk HPV strains are found, it is not likely that you have an increased  risk for cancer. Talk with your health care provider about what your results mean. Questions to ask your health care provider Ask your health care provider, or the department that is doing the test:  When will my results be ready?  How will I get my results?  What are my treatment options?  What other tests do I need?  What are my next steps? Summary  The human papillomavirus (HPV) test is used to look for high-risk types of HPV infection. This test is done only for females.  HPV types 16 and 18 are considered high-risk types of HPV. If untreated, these types of infections increase your risk for cancer of the cervix or anus.  A negative HPV test result means that no HPV was found, and it is very likely that you do not have HPV.  A positive HPV test result means that you have an HPV infection. This information is not intended to replace advice given to you by your health care provider. Make sure you discuss any questions you have with your health care provider. Document Released: 12/07/2004 Document Revised: 09/24/2017 Document Reviewed: 09/24/2017 Elsevier Interactive Patient Education  2019 [employer].

## 2019-01-30 NOTE — Progress Notes (Addendum)
43 y.o. [id number] Single Black or African American Not Hispanic or Latino female here as a new patient for an abnormal pap.  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  No prior abnormal pap's She is also concerned about fibroids. She intermittently has pressure in her pelvis, more on the right.  She had an ultrasound in 2015 that showed a fibroid (? 5 cm).  H/O endometriosis, h/o ectopic pregnancy. Not sexually active for 7 years.  Period Cycle (Days): 28 Period Duration (Days): 5 Period Pattern: Regular Menstrual Flow: Light Menstrual Control: Thin pad Menstrual Control Change Freq (Hours): 4 Dysmenorrhea: None  She states her cycles are very light, barely bleeds.   Patient's last menstrual period was 01/23/2019.          Sexually active: No.  The current method of family planning is abstinence.    Exercising: Yes.    gym Smoker:  no  Health Maintenance: Pap:  12/2018 abnormal per patient -- +HPV History of abnormal Pap:  yes MMG:  12/30/18 MMG incomplete -- 01/16/19 Right Breast US - BIRADS 1 negative/density c -- see Epic for details BMD:   n/a Colonoscopy: n/a TDaP:  2015 Gardasil: none   reports that she has never smoked. She has never used smokeless tobacco. She reports that she does not drink alcohol or use drugs.  Past Medical History:  Diagnosis Date  . Anemia   . Anxiety   . Depression   . Endometriosis   . Fibroid   . GERD (gastroesophageal reflux disease)   . HPV in female   . Migraines   . Thyroid disease     Past Surgical History:  Procedure Laterality Date  . BUNIONECTOMY Right   . CESAREAN SECTION    . DILATION AND CURETTAGE OF UTERUS    . ECTOPIC PREGNANCY SURGERY    . ORIF ULNAR FRACTURE Left 04/20/2016   Procedure: OPEN REDUCTION INTERNAL FIXATION (ORIF) LEFT ULNAR FRACTURE;  Surgeon: Charlotte Crumb, MD;  Location: [facility];  Service: Orthopedics;  Laterality: Left;    Current Outpatient  Medications  Medication Sig Dispense Refill  . ascorbic acid (VITAMIN C) 500 MG tablet Take by mouth.    . Biotin w/ Vitamins C & E (HAIR/SKIN/NAILS PO) Take by mouth.    . cyanocobalamin ([employer] VITAMIN B12) 1000 MCG tablet Take by mouth.    . hydrOXYzine (ATARAX/VISTARIL) 10 MG tablet TAKE 1 TABLET BY MOUTH THREE TIMES DAILY FOR ITCHING    . ibuprofen (ADVIL,MOTRIN) 600 MG tablet Take 1 tablet (600 mg total) by mouth every 6 (six) hours as needed. 30 tablet 0  . Melatonin 3 MG TABS Take by mouth.    . Multiple Vitamin (MULTI-VITAMIN DAILY PO) Take 1 tablet by mouth daily.    . Probiotic Product (PROBIOTIC-10 PO) Take by mouth.    . propranolol (INDERAL) 10 MG tablet Take 10 mg by mouth 3 (three) times daily.    Marland Kitchen VITAMIN D, CHOLECALCIFEROL, PO Take by mouth.    . triamcinolone cream (KENALOG) 0.5 %      No current facility-administered medications for this visit.     Family History  Problem Relation Age of Onset  . Diabetes Mother   . Hypertension Mother   . Heart disease Mother   . Pancreatic cancer Mother   . Heart attack Maternal Aunt   . Diabetes Maternal Aunt   . Congestive Heart Failure Maternal Aunt   .  Ovarian cancer Maternal Grandmother     Review of Systems  Constitutional: Negative.   HENT: Negative.   Eyes: Negative.   Respiratory: Negative.   Cardiovascular: Negative.   Gastrointestinal: Negative.   Endocrine: Negative.   Genitourinary: Negative.   Musculoskeletal: Negative.   Skin: Negative.   Allergic/Immunologic: Negative.   Neurological: Negative.   Hematological: Negative.   Psychiatric/Behavioral: Negative.     Exam:   BP 100/60 (BP Location: Right Arm, Patient Position: Sitting, Cuff Size: Large)   Pulse 84   Resp 16   Ht 5\' 3"  (1.6 m)   Wt 207 lb (93.9 kg)   LMP 01/23/2019   BMI 36.67 kg/m   Weight change: @WEIGHTCHANGE @ Height:   Height: 5\' 3"  (160 cm)  Ht Readings from Last 3 Encounters:  01/30/19 5\' 3"  (1.6 m)  04/20/16 5\' 2"  (1.575 m)   08/10/14 5' 2.5" (1.588 m)    General appearance: alert, cooperative and appears stated age Head: Normocephalic, without obvious abnormality, atraumatic Neck: no adenopathy, supple, symmetrical, trachea midline and thyroid normal to inspection and palpation Abdomen: soft, non-tender; non distended,  no masses,  no organomegaly Extremities: extremities normal, atraumatic, no cyanosis or edema Skin: Skin color, texture, turgor normal. No rashes or lesions Lymph nodes: Cervical, supraclavicular, and axillary nodes normal. No abnormal inguinal nodes palpated Neurologic: Grossly normal   Pelvic: External genitalia:  no lesions              Urethra:  normal appearing urethra with no masses, tenderness or lesions              Bartholins and Skenes: normal                 Vagina: normal appearing vagina with normal color and discharge, no lesions. No prolapse              Cervix: no lesions               Bimanual Exam:  Uterus:  normal size, contour, position, consistency, mobility, non-tender and anteverted. Exam slightly limited by BMI, no masses              Adnexa: no mass, fullness, tenderness               Rectovaginal: Confirms               Anus:  normal sphincter tone, no lesions Mons: slightly tender on the right under her scar.   Chaperone was present for exam.  A:  H/O HPV on pap, ? Abnormal pap  C/O intermittent pressure/pain in the right mons, normal pelvic exam  ?irregular cycles, they are very light  P:   Pap with hpv, including subtyping. Further plans depending on results. Patient requests repeat pap  Reassured her exam is normal  Calendar cycles  ~30 minutes spent face to face, over 50% in counseling  Addendum: Pap from 12/18/18 received and reviewed Pap was satisfactory, negative, with + HRHPV Creatinine was 1.2

## 2019-02-04 ENCOUNTER — Telehealth: Payer: Self-pay

## 2019-02-04 LAB — CYTOLOGY - PAP
Diagnosis: NEGATIVE
HPV (WINDOPATH): DETECTED — AB
HPV 16/18/45 genotyping: NEGATIVE

## 2019-02-04 NOTE — Telephone Encounter (Signed)
-----   Message from Salvadore Dom, MD sent at 02/04/2019  2:02 PM EDT ----- Please inform patient that her pap is negative, HPV + but negative for the more aggressive subtypes. She needs a repeat pap and hpv in one year, can do this with her primary. If her pap is abnormal (ASCUS or worse), or her HPV test is + she will need a colposcopy.  Please send cc to primary

## 2019-02-04 NOTE — Telephone Encounter (Signed)
Left message to call Lanaysia Fritchman at [phone]. 

## 2019-02-05 NOTE — Telephone Encounter (Signed)
Patient returned call

## 2019-02-05 NOTE — Telephone Encounter (Signed)
Left message to call Sinjin Amero at [phone]. 

## 2019-02-11 ENCOUNTER — Telehealth: Payer: Self-pay | Admitting: Obstetrics and Gynecology

## 2019-02-11 NOTE — Telephone Encounter (Signed)
Patient states she is returning a call to [place]. Patient is asking to talk with [facility] regarding getting a medication for her problem.

## 2019-02-11 NOTE — Telephone Encounter (Signed)
Spoke with patient. Results given. Phone line keeps getting disconnected. Return call to patient. Detailed message left for patient with all results and to return call with any additional questions. Encounter closed.

## 2019-02-11 NOTE — Telephone Encounter (Signed)
Patient returned call. Ok to leave a detailed message if she is not available.

## 2019-02-11 NOTE — Telephone Encounter (Signed)
Spoke with patient. Results given as seen below from [place]. Patient has many questions and concerns. Advised will need to be seen in the office to discuss with Dr.Jertson. Offered to scheduled OV. Patient states that she has also been having intermittent pelvic/vaginal pain/fullness for many years and is concerned. Requesting an ultrasound appointment and an appointment to discuss HPV in the same day as she lives out of town. Advised will review with Dr.Jertson and return call. Patient is agreeable.  Notes recorded by Sprague, Harley Hallmark, RN on 02/11/2019 at 1:37 PM EDT Spoke with patient. Results given. Phone line keeps getting disconnected. Return call to patient. Detailed message left for patient with all results and to return call with any additional questions. ------  Notes recorded by Sprague, Harley Hallmark, RN on 02/04/2019 at 4:29 PM EDT Left message to call Kaitlyn at [phone]. ------  Notes recorded by Salvadore Dom, MD on 02/04/2019 at 2:02 PM EDT Please inform patient that her pap is negative, HPV + but negative for the more aggressive subtypes. She needs a repeat pap and hpv in one year, can do this with her primary. If her pap is abnormal (ASCUS or worse), or her HPV test is + she will need a colposcopy.  Please send cc to primary

## 2019-02-13 NOTE — Telephone Encounter (Signed)
Will you please let the patient know that we are only seeing emergency visits at this time. We can put her on a list to be seen when we open up again. If she feels she needs a phone visit, then please set that up.

## 2019-02-16 NOTE — Telephone Encounter (Signed)
Left message to call Brenda Patton at [phone]. 

## 2019-02-19 NOTE — Telephone Encounter (Signed)
Left message to call Kaitlyn at [phone]. 

## 2019-02-25 NOTE — Telephone Encounter (Signed)
Reviewed with Dr.Jertson. Attempted to reach patient x 2 with no return call. Encounter closed.

## 2020-03-01 ENCOUNTER — Telehealth: Payer: Self-pay | Admitting: *Deleted

## 2020-03-01 NOTE — Telephone Encounter (Signed)
Message left to return call to [facility] [place] at [phone].   Patient in 08 recall for 03/21. Patient needs to schedule aex.

## 2020-05-10 NOTE — Telephone Encounter (Signed)
Call to patient. Patient advised needs to schedule aex. Patient declines to schedule at this time, wants to look at schedule and return call. Patient aware of the importance of scheduling annual well woman exam.

## 2020-06-10 NOTE — Telephone Encounter (Signed)
Letter mailed to patient's home address on file. Encounter closed. °

## 2020-07-21 NOTE — Progress Notes (Signed)
Patient referred by Emelia Loron, NP for chest pain  Subjective:   B[facility]da [facility]ton, female    DOB: 06-16-1976, 44 y.o.   MRN: [id number]   Chief Complaint  Patient presents with  . Shortness of Breath  . New Patient (Initial Visit)  . Chest Pain     HPI  44 y/o [place] American female with obesity, mild dyslipidemia, family h/o early CAD, with chest pain, exertional dyspnea   Patient reports left sided chest pain with emotional and physical stress. She has had episodes while walking on treadmill, last for a few min, improve with rest. She also reports associated shortness of breath   Past Medical History:  Diagnosis Date  . Anemia   . Anxiety   . Depression   . Endometriosis   . Fibroid   . GERD (gastroesophageal reflux disease)   . HPV in female   . Migraines   . Thyroid disease      Past Surgical History:  Procedure Laterality Date  . BUNIONECTOMY Right   . CESAREAN SECTION    . DILATION AND CURETTAGE OF UTERUS    . ECTOPIC PREGNANCY SURGERY    . ORIF ULNAR FRACTURE Left 04/20/2016   Procedure: OPEN REDUCTION INTERNAL FIXATION (ORIF) LEFT ULNAR FRACTURE;  Surgeon: Charlotte Crumb, MD;  Location: [place];  Service: Orthopedics;  Laterality: Left;     Social History   Tobacco Use  Smoking Status Never Smoker  Smokeless Tobacco Never Used    Social History   Substance and Sexual Activity  Alcohol Use No     Family History  Problem Relation Age of Onset  . Diabetes Mother   . Hypertension Mother   . Heart disease Mother   . Pancreatic cancer Mother   . Heart attack Maternal Aunt   . Diabetes Maternal Aunt   . Congestive Heart Failure Maternal Aunt   . Ovarian cancer Maternal Grandmother      Current Outpatient Medications on File Prior to Visit  Medication Sig Dispense Refill  . ascorbic acid (VITAMIN C) 500 MG tablet Take by mouth.    . Biotin w/ Vitamins C & E (HAIR/SKIN/NAILS PO) Take by mouth.    .  cyanocobalamin ([employer] VITAMIN B12) 1000 MCG tablet Take by mouth.    . hydrOXYzine (ATARAX/VISTARIL) 10 MG tablet TAKE 1 TABLET BY MOUTH THREE TIMES DAILY FOR ITCHING    . ibuprofen (ADVIL,MOTRIN) 600 MG tablet Take 1 tablet (600 mg total) by mouth every 6 (six) hours as needed. 30 tablet 0  . Melatonin 3 MG TABS Take by mouth.    . Multiple Vitamin (MULTI-VITAMIN DAILY PO) Take 1 tablet by mouth daily.    . Probiotic Product (PROBIOTIC-10 PO) Take by mouth.    . propranolol (INDERAL) 10 MG tablet Take 10 mg by mouth 3 (three) times daily.    Marland Kitchen triamcinolone cream (KENALOG) 0.5 %     . VITAMIN D, CHOLECALCIFEROL, PO Take by mouth.     No current facility-administered medications on file prior to visit.    Cardiovascular and other pertinent studies:  EKG 07/22/2020: Sinus rhythm 76 bpm Normal EKG   Echocardiogram 06/03/2020: LVEF 55-60%. No significant valvular abnormality   Recent labs: 05/14/2020: 05/31/2020 Glucose 87, BUN/Cr 14/0.89. EGFR 91. Na/K 138/4.0. Alkaline Phosphatase: 45 H/H 11.9/37.2. MCV 92. Platelets 292 HbA1C 5.6% Chol 204, TG 40, HDL 90, LDL 106 TSH 0.37 normal    Review of Systems  Cardiovascular: Positive for dyspnea on exertion.  Negative for chest pain, leg swelling, palpitations and syncope.  Respiratory: Positive for shortness of breath.          Vitals:   07/22/20 1524  BP: 112/79  Pulse: 82  Resp: 16  SpO2: 98%     Body mass index is 36.03 kg/m. Filed Weights   07/22/20 1524  Weight: 197 lb (89.4 kg)     Objective:   Physical Exam Vitals and nursing note reviewed.  Constitutional:      General: She is not in acute distress. Neck:     Vascular: No JVD.  Cardiovascular:     Rate and Rhythm: Normal rate and regular rhythm.     Heart sounds: Normal heart sounds. No murmur heard.   Pulmonary:     Effort: Pulmonary effort is normal.     Breath sounds: Normal breath sounds. No wheezing or rales.            Assessment &  Recommendations:   44 y/o [place] American female with obesity, mild dyslipidemia, family h/o early CAD, with chest pain, exertional dyspnea   Chest pain: Some features of aypical angina. Will obtain coronary CTA Recommend Aspirin 81 mg, prn nitro for now.   Thank you for referring the patient to [place]. Please feel free to contact with any questions.   Nigel Mormon, MD Pager: [phone] Office: [phone]

## 2020-07-22 ENCOUNTER — Other Ambulatory Visit: Payer: Self-pay

## 2020-07-22 ENCOUNTER — Ambulatory Visit: Payer: 59 | Admitting: Cardiology

## 2020-07-22 ENCOUNTER — Encounter: Payer: Self-pay | Admitting: Cardiology

## 2020-07-22 VITALS — BP 112/79 | HR 82 | Resp 16 | Ht 62.0 in | Wt 197.0 lb

## 2020-07-22 DIAGNOSIS — F419 Anxiety disorder, unspecified: Secondary | ICD-10-CM

## 2020-07-22 DIAGNOSIS — R0609 Other forms of dyspnea: Secondary | ICD-10-CM

## 2020-07-22 DIAGNOSIS — I209 Angina pectoris, unspecified: Secondary | ICD-10-CM

## 2020-07-22 MED ORDER — DIAZEPAM 2 MG PO TABS: 2.0000 mg | ORAL_TABLET | Freq: Four times a day (QID) | ORAL | 0 refills | Status: DC | PRN

## 2020-07-22 MED ORDER — ASPIRIN EC 81 MG PO TBEC: 81.0000 mg | DELAYED_RELEASE_TABLET | Freq: Every day | ORAL | 1 refills | Status: DC

## 2020-07-22 MED ORDER — NITROGLYCERIN 0.4 MG SL SUBL: 0.4000 mg | SUBLINGUAL_TABLET | SUBLINGUAL | 1 refills | Status: DC | PRN

## 2020-07-22 MED ORDER — METOPROLOL TARTRATE 50 MG PO TABS: 50.0000 mg | ORAL_TABLET | Freq: Two times a day (BID) | ORAL | 0 refills | Status: DC

## 2020-07-22 NOTE — Patient Instructions (Signed)
Your cardiac CT will be scheduled at the locations below:   [facility]  [address]  [place],  [address]  [phone]    If scheduled at [facility], please arrive at the [facility] main entrance of [facility] 30-45 minutes prior to test start time.  Proceed to the [facility] Radiology Department (first floor) to check-in and test prep.   Please follow these instructions carefully (unless otherwise directed):    On the Night Before the Test:   Be sure to Drink plenty of water.   Do not consume any caffeinated/decaffeinated beverages or chocolate 12 hours prior to your test.   Do not take any antihistamines 12 hours prior to your test.   If the patient has contrast allergy:  Patient will need a prescription for Prednisone and very clear instructions (as follows):  1. Prednisone 50 mg - take 13 hours prior to test  2. Take another Prednisone 50 mg 7 hours prior to test  3. Take another Prednisone 50 mg 1 hour prior to test  4. Take Benadryl 50 mg 1 hour prior to test   Patient must complete all four doses of above prophylactic medications.   Patient will need a ride after test due to Benadryl.   On the Day of the Test:   Drink plenty of water. Do not drink any water within one hour of the test.   Do not eat any food 4 hours prior to the test.   You may take your regular medications prior to the test.    - Metoprolol tartarate 50 mg 2 hours before CT scan You may stop it after the CT scan, unless specified otherwise by me.    FEMALES- please wear underwire-free bra if available          After the Test:   Drink plenty of water.   After receiving IV contrast, you may experience a mild flushed feeling. This is normal.   On occasion, you may experience a mild rash up to 24 hours after the test. This is not dangerous. If this occurs, you can take Benadryl 25 mg and increase your fluid intake.   If you experience trouble  breathing, this can be serious. If it is severe call 911 IMMEDIATELY. If it is mild, please call our office.   If you take any of these medications: Glipizide/Metformin, Avandament, Glucavance, please do not take 48 hours after completing test unless otherwise instructed.     Please contact the cardiac imaging nurse navigator should you have any questions/concerns  Marchia Bond, RN Navigator Cardiac Imaging  [facility]  [phone] Office  [phone] Cell

## 2020-07-23 ENCOUNTER — Encounter: Payer: Self-pay | Admitting: Cardiology

## 2020-07-23 DIAGNOSIS — F419 Anxiety disorder, unspecified: Secondary | ICD-10-CM | POA: Insufficient documentation

## 2020-07-23 DIAGNOSIS — R0609 Other forms of dyspnea: Secondary | ICD-10-CM | POA: Insufficient documentation

## 2020-07-23 DIAGNOSIS — I209 Angina pectoris, unspecified: Secondary | ICD-10-CM | POA: Insufficient documentation

## 2020-07-28 ENCOUNTER — Other Ambulatory Visit: Payer: Self-pay | Admitting: Cardiology

## 2020-07-28 DIAGNOSIS — R0609 Other forms of dyspnea: Secondary | ICD-10-CM

## 2020-07-28 DIAGNOSIS — I209 Angina pectoris, unspecified: Secondary | ICD-10-CM

## 2020-08-03 ENCOUNTER — Telehealth: Payer: Self-pay

## 2020-08-05 ENCOUNTER — Ambulatory Visit: Payer: 59 | Admitting: Cardiology

## 2020-10-12 ENCOUNTER — Other Ambulatory Visit: Payer: Self-pay

## 2020-10-12 ENCOUNTER — Encounter (HOSPITAL_BASED_OUTPATIENT_CLINIC_OR_DEPARTMENT_OTHER): Payer: Self-pay

## 2020-10-12 ENCOUNTER — Emergency Department (HOSPITAL_BASED_OUTPATIENT_CLINIC_OR_DEPARTMENT_OTHER)
Admission: EM | Admit: 2020-10-12 | Discharge: 2020-10-12 | Disposition: A | Discharge status: Home / Self Care | Payer: 59 | Attending: Emergency Medicine | Admitting: Emergency Medicine

## 2020-10-12 DIAGNOSIS — Z7982 Long term (current) use of aspirin: Secondary | ICD-10-CM | POA: Diagnosis not present

## 2020-10-12 DIAGNOSIS — Z79899 Other long term (current) drug therapy: Secondary | ICD-10-CM | POA: Diagnosis not present

## 2020-10-12 DIAGNOSIS — U071 COVID-19: Secondary | ICD-10-CM | POA: Diagnosis not present

## 2020-10-12 DIAGNOSIS — Z91040 Latex allergy status: Secondary | ICD-10-CM | POA: Insufficient documentation

## 2020-10-12 DIAGNOSIS — R0602 Shortness of breath: Secondary | ICD-10-CM | POA: Diagnosis present

## 2020-10-12 LAB — COMPREHENSIVE METABOLIC PANEL
ALT: 40 U/L (ref 0–44)
AST: 43 U/L — ABNORMAL HIGH (ref 15–41)
Albumin: 3.3 g/dL — ABNORMAL LOW (ref 3.5–5.0)
Alkaline Phosphatase: 40 U/L (ref 38–126)
Anion gap: 9 (ref 5–15)
BUN: 9 mg/dL (ref 6–20)
CO2: 22 mmol/L (ref 22–32)
Calcium: 8.3 mg/dL — ABNORMAL LOW (ref 8.9–10.3)
Chloride: 103 mmol/L (ref 98–111)
Creatinine, Ser: 1.11 mg/dL — ABNORMAL HIGH (ref 0.44–1.00)
GFR, Estimated: >60 mL/min (ref >60)
Glucose, Bld: 99 mg/dL (ref 70–99)
Potassium: 3.7 mmol/L (ref 3.5–5.1)
Sodium: 134 mmol/L — ABNORMAL LOW (ref 135–145)
Total Bilirubin: 0.4 mg/dL (ref 0.3–1.2)
Total Protein: 7.5 g/dL (ref 6.5–8.1)

## 2020-10-12 LAB — CBC WITH DIFFERENTIAL/PLATELET
Abs Immature Granulocytes: 0.02 10*3/uL (ref 0.00–0.07)
Basophils Absolute: 0 10*3/uL (ref 0.0–0.1)
Basophils Relative: 1 %
Eosinophils Absolute: 0 10*3/uL (ref 0.0–0.5)
Eosinophils Relative: 0 %
HCT: 35.8 % — ABNORMAL LOW (ref 36.0–46.0)
Hemoglobin: 12 g/dL (ref 12.0–15.0)
Immature Granulocytes: 1 %
Lymphocytes Relative: 20 %
Lymphs Abs: 0.8 10*3/uL (ref 0.7–4.0)
MCH: 29.9 pg (ref 26.0–34.0)
MCHC: 33.5 g/dL (ref 30.0–36.0)
MCV: 89.3 fL (ref 80.0–100.0)
Monocytes Absolute: 0.3 10*3/uL (ref 0.1–1.0)
Monocytes Relative: 8 %
Neutro Abs: 2.9 10*3/uL (ref 1.7–7.7)
Neutrophils Relative %: 70 %
Platelets: 196 10*3/uL (ref 150–400)
RBC: 4.01 MIL/uL (ref 3.87–5.11)
RDW: 13 % (ref 11.5–15.5)
WBC: 4 10*3/uL (ref 4.0–10.5)
nRBC: 0 % (ref 0.0–0.2)

## 2020-10-12 MED ORDER — SODIUM CHLORIDE 0.9 % IV BOLUS
1000.0000 mL | Freq: Once | INTRAVENOUS | Status: AC
  Administered 2020-10-12: 1000 mL via INTRAVENOUS

## 2020-10-12 MED ORDER — GUAIFENESIN ER 600 MG PO TB12: 600.0000 mg | ORAL_TABLET | Freq: Two times a day (BID) | ORAL | 0 refills | Status: DC

## 2020-10-12 MED ORDER — ACETAMINOPHEN 160 MG/5ML PO SOLN
ORAL | Status: AC
  Administered 2020-10-12: 650 mg via ORAL
  Filled 2020-10-12: qty 20.3

## 2020-10-12 MED ORDER — ACETAMINOPHEN 160 MG/5ML PO SOLN: 650.0000 mg | Freq: Once | ORAL | Status: AC

## 2020-10-12 MED ORDER — ONDANSETRON HCL 4 MG/2ML IJ SOLN
4.0000 mg | Freq: Once | INTRAMUSCULAR | Status: AC
  Administered 2020-10-12: 4 mg via INTRAVENOUS
  Filled 2020-10-12: qty 2

## 2020-10-12 MED ORDER — BENZONATATE 100 MG PO CAPS: 100.0000 mg | ORAL_CAPSULE | Freq: Three times a day (TID) | ORAL | 0 refills | Status: AC

## 2020-10-12 MED ORDER — ONDANSETRON HCL 4 MG PO TABS: 4.0000 mg | ORAL_TABLET | Freq: Four times a day (QID) | ORAL | 0 refills | Status: DC

## 2020-10-12 NOTE — ED Provider Notes (Signed)
[facility] Provider Note   CSN: [id number] Arrival date & time: 10/12/20  1519     History Chief Complaint  Patient presents with  . Covid Positive    Brenda Patton is a 44 y.o. female.  HPI   44 year old female history of anemia, anxiety, depression, endometriosis, fibroid, GERD, HPV, migraines, thyroid disease, presents emergency department today for evaluation of Covid symptoms.  States she was diagnosed Covid 1 week ago.  Symptoms started with shortness of breath and a cough.  She later developed fevers, nausea vomiting and diarrhea.  States that shortness of breath has improved.  She still has a mild cough.  She is concerned because she has been having persistent fevers that she feels like she cannot control with medications and she also is concerned because she has been unable to keep any foods down she feels like she is getting dehydrated.  She is unvaccinated.  Past Medical History:  Diagnosis Date  . Anemia   . Anxiety   . Depression   . Endometriosis   . Fibroid   . GERD (gastroesophageal reflux disease)   . HPV in female   . Migraines   . Thyroid disease     Patient Active Problem List   Diagnosis Date Noted  . Angina pectoris ([place]) 07/23/2020  . Anxiety 07/23/2020  . Exertional dyspnea 07/23/2020    Past Surgical History:  Procedure Laterality Date  . BUNIONECTOMY Right   . CESAREAN SECTION    . DILATION AND CURETTAGE OF UTERUS    . ECTOPIC PREGNANCY SURGERY    . ORIF ULNAR FRACTURE Left 04/20/2016   Procedure: OPEN REDUCTION INTERNAL FIXATION (ORIF) LEFT ULNAR FRACTURE;  Surgeon: Charlotte Crumb, MD;  Location: [place];  Service: Orthopedics;  Laterality: Left;     OB History    Gravida  4   Para  2   Term  0   Preterm  0   AB  2   Living  2     SAB  1   TAB  0   Ectopic  1   Multiple  0   Live Births  0           Family History  Problem Relation Age of Onset  .  Diabetes Mother   . Hypertension Mother   . Heart disease Mother   . Pancreatic cancer Mother   . Heart attack Maternal Aunt   . Diabetes Maternal Aunt   . Congestive Heart Failure Maternal Aunt   . Ovarian cancer Maternal Grandmother     Social History   Tobacco Use  . Smoking status: Never Smoker  . Smokeless tobacco: Never Used  Vaping Use  . Vaping Use: Never used  Substance Use Topics  . Alcohol use: No  . Drug use: No    Home Medications Prior to Admission medications   Medication Sig Start Date End Date Taking? Authorizing Provider  albuterol (VENTOLIN HFA) 108 (90 Base) MCG/ACT inhaler Inhale into the lungs. 05/10/20   [provider]  ascorbic acid (VITAMIN C) 500 MG tablet Take by mouth.    [provider]  aspirin EC 81 MG tablet Take 1 tablet (81 mg total) by mouth daily. Swallow whole. 07/22/20   Patwardhan, Reynold Bowen, MD  benzonatate (TESSALON) 100 MG capsule Take 1 capsule (100 mg total) by mouth every 8 (eight) hours for 5 days. 10/12/20 10/17/20  Davin Muramoto S, PA-C  Biotin w/ Vitamins C &  E (HAIR/SKIN/NAILS PO) Take by mouth.    [provider]  clobetasol (TEMOVATE) 0.05 % external solution Apply topically. 07/02/20   [provider]  cyanocobalamin ([employer]S VITAMIN B12) 1000 MCG tablet Take by mouth.    [provider]  diazepam (VALIUM) 2 MG tablet Take 1 tablet (2 mg total) by mouth every 6 (six) hours as needed for anxiety. 07/22/20   Patwardhan, Reynold Bowen, MD  fluticasone (FLONASE) 50 MCG/ACT nasal spray daily as needed. 05/10/20 05/10/21  [provider]  guaiFENesin (MUCINEX) 600 MG 12 hr tablet Take 1 tablet (600 mg total) by mouth 2 (two) times daily. 10/12/20   Indigo Chaddock S, PA-C  hydrocortisone 2.5 % ointment Apply topically. 07/03/20   [provider]  ketoconazole (NIZORAL) 2 % shampoo Apply 1 application topically 2 (two) times a week. 07/02/20   [provider]  metoprolol tartrate  (LOPRESSOR) 50 MG tablet Take 1 tablet (50 mg total) by mouth 2 (two) times daily. 50 mg 2 hours before CT scan You may stop it after the CT scan, unless specified otherwise by me. 07/22/20 10/20/20  Nigel Mormon, MD  Multiple Vitamin (MULTI-VITAMIN DAILY PO) Take 1 tablet by mouth daily.    [provider]  nitroGLYCERIN (NITROSTAT) 0.4 MG SL tablet Place 1 tablet (0.4 mg total) under the tongue every 5 (five) minutes as needed for chest pain. 07/22/20 10/20/20  Patwardhan, Reynold Bowen, MD  ondansetron (ZOFRAN) 4 MG tablet Take 1 tablet (4 mg total) by mouth every 6 (six) hours. 10/12/20   Lynnetta Tom S, PA-C  triamcinolone cream (KENALOG) 0.5 %  12/28/18   [provider]  VITAMIN D, CHOLECALCIFEROL, PO Take by mouth.    [provider]    Allergies    Penicillins and Latex  Review of Systems   Review of Systems  Constitutional: Positive for chills, diaphoresis and fever.  HENT: Negative for ear pain and sore throat.   Eyes: Negative for visual disturbance.  Respiratory: Positive for cough. Negative for shortness of breath.   Cardiovascular: Negative for chest pain.  Gastrointestinal: Positive for diarrhea, nausea and vomiting. Negative for abdominal pain.  Genitourinary: Negative for dysuria and hematuria.  Musculoskeletal: Positive for myalgias.  Skin: Negative for color change and rash.  Neurological: Positive for headaches. Negative for seizures and syncope.  All other systems reviewed and are negative.   Physical Exam Updated Vital Signs BP 108/67   Pulse 96   Temp (!) 101.5 F (38.6 C) (Oral)   Resp 17   Ht 5\' 2"  (1.575 m)   Wt 90.7 kg   SpO2 98%   BMI 36.58 kg/m   Physical Exam Vitals and nursing note reviewed.  Constitutional:      General: She is not in acute distress.    Appearance: She is well-developed.  HENT:     Head: Normocephalic and atraumatic.  Eyes:     Conjunctiva/sclera: Conjunctivae normal.  Cardiovascular:      Rate and Rhythm: Normal rate and regular rhythm.     Heart sounds: Normal heart sounds. No murmur heard.   Pulmonary:     Effort: Pulmonary effort is normal. No respiratory distress.     Breath sounds: Normal breath sounds. No wheezing, rhonchi or rales.  Abdominal:     General: Bowel sounds are normal.     Palpations: Abdomen is soft.     Tenderness: There is no abdominal tenderness. There is no guarding or rebound.  Musculoskeletal:  Cervical back: Neck supple.  Skin:    General: Skin is warm and dry.  Neurological:     Mental Status: She is alert.     ED Results / Procedures / Treatments   Labs (all labs ordered are listed, but only abnormal results are displayed) Labs Reviewed  CBC WITH DIFFERENTIAL/PLATELET - Abnormal; Notable for the following components:      Result Value   HCT 35.8 (*)    All other components within normal limits  COMPREHENSIVE METABOLIC PANEL - Abnormal; Notable for the following components:   Sodium 134 (*)    Creatinine, Ser 1.11 (*)    Calcium 8.3 (*)    Albumin 3.3 (*)    AST 43 (*)    All other components within normal limits    EKG None  Radiology No results found.  Procedures Procedures (including critical care time)  Medications Ordered in ED Medications  acetaminophen (TYLENOL) 160 MG/5ML solution 650 mg (650 mg Oral Given 10/12/20 1534)  sodium chloride 0.9 % bolus 1,000 mL (1,000 mLs Intravenous New Bag/Given 10/12/20 1646)  ondansetron (ZOFRAN) injection 4 mg (4 mg Intravenous Given 10/12/20 1646)    ED Course  I have reviewed the triage vital signs and the nursing notes.  Pertinent labs & imaging results that were available during my care of the patient were reviewed by me and considered in my medical decision making (see chart for details).    MDM Rules/Calculators/A&P                          44 year old female here for evaluation of Covid symptoms.  Shortness of breath has improved since onset and her main  concern today is that she has been unable to keep fevers under control and has been vomiting and having diarrhea at home so she thinks she is dehydrated.  She does appear little bit dry on exam she was given some IV fluids.  She had a fever initially so was given some Tylenol, following this she felt some improvement.  She is able to tolerate p.o.  Labs not show any leukocytosis/leukopenia, anemia.  No significant electrolyte derangement, AKI or abnormal LFTs.  Work-up today is reassuring and she is feeling improved after interventions today.  Advised I will give Rx for Zofran, cough medication, Mucinex for home.  I also offered monoclonal antibodies given patient's elevated BMI however she is not interested in this therapy.  Advised that she follow-up with her PCP and return the emergency department for new or worsening symptoms.  She voiced understanding the plan reasons to return.  All questions answered.  Patient stable for discharge.  Final Clinical Impression(s) / ED Diagnoses Final diagnoses:  COVID    Rx / DC Orders ED Discharge Orders         Ordered    ondansetron (ZOFRAN) 4 MG tablet  Every 6 hours        10/12/20 1801    benzonatate (TESSALON) 100 MG capsule  Every 8 hours        10/12/20 1801    guaiFENesin (MUCINEX) 600 MG 12 hr tablet  2 times daily        10/12/20 1801           Rodney Booze, PA-C 10/12/20 1802    Lucrezia Starch, MD 10/14/20 [phone]

## 2020-10-12 NOTE — ED Notes (Signed)
O2 Dat 94-95% RA ambulating from waiting room to room 9

## 2020-10-12 NOTE — Discharge Instructions (Addendum)
Take medications as prescribed.   You should be isolated for at least 7 days since the onset of your symptoms AND >72 hours after symptoms resolution (absence of fever without the use of fever reducing medicaiton and improvement in respiratory symptoms), whichever is longer  Please follow up with your primary care provider within 5-7 days for re-evaluation of your symptoms. If you do not have a primary care provider, information for a healthcare clinic has been provided for you to make arrangements for follow up care. Please return to the emergency department for any new or worsening symptoms.

## 2020-10-12 NOTE — ED Triage Notes (Signed)
Pt reports +covid test 11/14-states she is continuing to have fever, chills, n/v-NAD-steady gait

## 2020-10-12 NOTE — ED Notes (Signed)
She is slowly and comfortably drinking ginger ale.

## 2020-10-16 ENCOUNTER — Emergency Department (HOSPITAL_COMMUNITY): Payer: 59

## 2020-10-16 ENCOUNTER — Emergency Department (HOSPITAL_COMMUNITY)
Admission: EM | Admit: 2020-10-16 | Discharge: 2020-10-17 | Disposition: A | Discharge status: Home / Self Care | Payer: 59 | Attending: Emergency Medicine | Admitting: Emergency Medicine

## 2020-10-16 ENCOUNTER — Encounter (HOSPITAL_COMMUNITY): Payer: Self-pay

## 2020-10-16 ENCOUNTER — Other Ambulatory Visit: Payer: Self-pay

## 2020-10-16 DIAGNOSIS — Z7982 Long term (current) use of aspirin: Secondary | ICD-10-CM | POA: Diagnosis not present

## 2020-10-16 DIAGNOSIS — R0602 Shortness of breath: Secondary | ICD-10-CM | POA: Diagnosis present

## 2020-10-16 DIAGNOSIS — Z91040 Latex allergy status: Secondary | ICD-10-CM | POA: Insufficient documentation

## 2020-10-16 DIAGNOSIS — U071 COVID-19: Secondary | ICD-10-CM | POA: Insufficient documentation

## 2020-10-16 LAB — C-REACTIVE PROTEIN: CRP: 6.2 mg/dL — ABNORMAL HIGH (ref <1.0)

## 2020-10-16 LAB — COMPREHENSIVE METABOLIC PANEL
ALT: 52 U/L — ABNORMAL HIGH (ref 0–44)
AST: 59 U/L — ABNORMAL HIGH (ref 15–41)
Albumin: 3.1 g/dL — ABNORMAL LOW (ref 3.5–5.0)
Alkaline Phosphatase: 34 U/L — ABNORMAL LOW (ref 38–126)
Anion gap: 9 (ref 5–15)
BUN: 11 mg/dL (ref 6–20)
CO2: 24 mmol/L (ref 22–32)
Calcium: 8.2 mg/dL — ABNORMAL LOW (ref 8.9–10.3)
Chloride: 104 mmol/L (ref 98–111)
Creatinine, Ser: 1.13 mg/dL — ABNORMAL HIGH (ref 0.44–1.00)
GFR, Estimated: >60 mL/min (ref >60)
Glucose, Bld: 107 mg/dL — ABNORMAL HIGH (ref 70–99)
Potassium: 3.8 mmol/L (ref 3.5–5.1)
Sodium: 137 mmol/L (ref 135–145)
Total Bilirubin: 0.6 mg/dL (ref 0.3–1.2)
Total Protein: 7.1 g/dL (ref 6.5–8.1)

## 2020-10-16 LAB — CBC WITH DIFFERENTIAL/PLATELET
Abs Immature Granulocytes: 0.02 10*3/uL (ref 0.00–0.07)
Basophils Absolute: 0 10*3/uL (ref 0.0–0.1)
Basophils Relative: 0 %
Eosinophils Absolute: 0 10*3/uL (ref 0.0–0.5)
Eosinophils Relative: 0 %
HCT: 36.3 % (ref 36.0–46.0)
Hemoglobin: 11.7 g/dL — ABNORMAL LOW (ref 12.0–15.0)
Immature Granulocytes: 1 %
Lymphocytes Relative: 31 %
Lymphs Abs: 1.2 10*3/uL (ref 0.7–4.0)
MCH: 29.5 pg (ref 26.0–34.0)
MCHC: 32.2 g/dL (ref 30.0–36.0)
MCV: 91.7 fL (ref 80.0–100.0)
Monocytes Absolute: 0.2 10*3/uL (ref 0.1–1.0)
Monocytes Relative: 5 %
Neutro Abs: 2.3 10*3/uL (ref 1.7–7.7)
Neutrophils Relative %: 63 %
Platelets: 193 10*3/uL (ref 150–400)
RBC: 3.96 MIL/uL (ref 3.87–5.11)
RDW: 12.9 % (ref 11.5–15.5)
WBC: 3.7 10*3/uL — ABNORMAL LOW (ref 4.0–10.5)
nRBC: 0 % (ref 0.0–0.2)

## 2020-10-16 LAB — I-STAT BETA HCG BLOOD, ED (MC, WL, AP ONLY): I-stat hCG, quantitative: <5 m[IU]/mL (ref <5)

## 2020-10-16 LAB — TRIGLYCERIDES: Triglycerides: 60 mg/dL (ref <150)

## 2020-10-16 LAB — PROCALCITONIN: Procalcitonin: <0.1 ng/mL

## 2020-10-16 LAB — LACTATE DEHYDROGENASE: LDH: 322 U/L — ABNORMAL HIGH (ref 98–192)

## 2020-10-16 LAB — LACTIC ACID, PLASMA: Lactic Acid, Venous: 0.6 mmol/L (ref 0.5–1.9)

## 2020-10-16 LAB — FIBRINOGEN: Fibrinogen: 373 mg/dL (ref 210–475)

## 2020-10-16 LAB — FERRITIN: Ferritin: 329 ng/mL — ABNORMAL HIGH (ref 11–307)

## 2020-10-16 LAB — D-DIMER, QUANTITATIVE: D-Dimer, Quant: 1.51 ug/mL-FEU — ABNORMAL HIGH (ref 0.00–0.50)

## 2020-10-16 MED ORDER — DEXAMETHASONE SODIUM PHOSPHATE 10 MG/ML IJ SOLN
6.0000 mg | Freq: Once | INTRAMUSCULAR | Status: AC
  Administered 2020-10-16: 6 mg via INTRAVENOUS
  Filled 2020-10-16: qty 1

## 2020-10-16 MED ORDER — SODIUM CHLORIDE 0.9% FLUSH: 3.0000 mL | Freq: Two times a day (BID) | INTRAVENOUS | Status: DC

## 2020-10-16 MED ORDER — SODIUM CHLORIDE 0.9 % IV SOLN: 250.0000 mL | INTRAVENOUS | Status: DC | PRN

## 2020-10-16 MED ORDER — SODIUM CHLORIDE 0.9 % IV BOLUS
500.0000 mL | Freq: Once | INTRAVENOUS | Status: AC
  Administered 2020-10-16: 500 mL via INTRAVENOUS

## 2020-10-16 MED ORDER — SODIUM CHLORIDE 0.9% FLUSH: 3.0000 mL | INTRAVENOUS | Status: DC | PRN

## 2020-10-16 MED ORDER — ALBUTEROL SULFATE HFA 108 (90 BASE) MCG/ACT IN AERS
4.0000 | INHALATION_SPRAY | RESPIRATORY_TRACT | Status: DC | PRN
  Administered 2020-10-16: 4 via RESPIRATORY_TRACT
  Filled 2020-10-16: qty 6.7

## 2020-10-16 NOTE — Discharge Instructions (Addendum)
Your lab results and monitoring remains reassuring. We suspect you are at the peak of your infection, and anticipate improvement in your symptoms over the next 3-4 days.  Return to the ER if you start having worsening shortness of breath, chest pain, fainting or near fainting spell. See your doctor in 7-10 days.

## 2020-10-16 NOTE — ED Provider Notes (Signed)
[facility] DEPT Provider Note   CSN: [id number] Arrival date & time: 10/16/20  1845     History Chief Complaint  Patient presents with  . Covid Positive  . Generalized Body Aches  . Shortness of Breath    Brenda Patton is a 44 y.o. female.  HPI    44 year old female comes in a chief complaint of body aches and shortness of breath.  She has history of COVID-19, endometriosis.  Patient is unvaccinated.  She was diagnosed with COVID-19 few days back.  She started having symptoms last Wednesday.  Patient reports that currently she is feeling short of breath with exertion.  She has chest discomfort only when she coughs.  She is having chills but denies any fevers.  Review of system is negative for nausea, vomiting and positive for body aches.  Past Medical History:  Diagnosis Date  . Anemia   . Anxiety   . Depression   . Endometriosis   . Fibroid   . GERD (gastroesophageal reflux disease)   . HPV in female   . Migraines   . Thyroid disease     Patient Active Problem List   Diagnosis Date Noted  . Angina pectoris ([facility]) 07/23/2020  . Anxiety 07/23/2020  . Exertional dyspnea 07/23/2020    Past Surgical History:  Procedure Laterality Date  . BUNIONECTOMY Right   . CESAREAN SECTION    . DILATION AND CURETTAGE OF UTERUS    . ECTOPIC PREGNANCY SURGERY    . ORIF ULNAR FRACTURE Left 04/20/2016   Procedure: OPEN REDUCTION INTERNAL FIXATION (ORIF) LEFT ULNAR FRACTURE;  Surgeon: Charlotte Crumb, MD;  Location: [facility];  Service: Orthopedics;  Laterality: Left;     OB History    Gravida  4   Para  2   Term  0   Preterm  0   AB  2   Living  2     SAB  1   TAB  0   Ectopic  1   Multiple  0   Live Births  0           Family History  Problem Relation Age of Onset  . Diabetes Mother   . Hypertension Mother   . Heart disease Mother   . Pancreatic cancer Mother   . Heart attack Maternal Aunt   .  Diabetes Maternal Aunt   . Congestive Heart Failure Maternal Aunt   . Ovarian cancer Maternal Grandmother     Social History   Tobacco Use  . Smoking status: Never Smoker  . Smokeless tobacco: Never Used  Vaping Use  . Vaping Use: Never used  Substance Use Topics  . Alcohol use: No  . Drug use: No    Home Medications Prior to Admission medications   Medication Sig Start Date End Date Taking? Authorizing Provider  albuterol (VENTOLIN HFA) 108 (90 Base) MCG/ACT inhaler Inhale into the lungs. 05/10/20   [provider]  ascorbic acid (VITAMIN C) 500 MG tablet Take by mouth.    [provider]  aspirin EC 81 MG tablet Take 1 tablet (81 mg total) by mouth daily. Swallow whole. 07/22/20   Patwardhan, Reynold Bowen, MD  benzonatate (TESSALON) 100 MG capsule Take 1 capsule (100 mg total) by mouth every 8 (eight) hours for 5 days. 10/12/20 10/17/20  Couture, Cortni S, PA-C  Biotin w/ Vitamins C & E (HAIR/SKIN/NAILS PO) Take by mouth.    [provider]  clobetasol (TEMOVATE)  0.05 % external solution Apply topically. 07/02/20   [provider]  cyanocobalamin ([employer] VITAMIN B12) 1000 MCG tablet Take by mouth.    [provider]  diazepam (VALIUM) 2 MG tablet Take 1 tablet (2 mg total) by mouth every 6 (six) hours as needed for anxiety. 07/22/20   Patwardhan, Reynold Bowen, MD  fluticasone (FLONASE) 50 MCG/ACT nasal spray daily as needed. 05/10/20 05/10/21  [provider]  guaiFENesin (MUCINEX) 600 MG 12 hr tablet Take 1 tablet (600 mg total) by mouth 2 (two) times daily. 10/12/20   Couture, Cortni S, PA-C  hydrocortisone 2.5 % ointment Apply topically. 07/03/20   [provider]  ketoconazole (NIZORAL) 2 % shampoo Apply 1 application topically 2 (two) times a week. 07/02/20   [provider]  metoprolol tartrate (LOPRESSOR) 50 MG tablet Take 1 tablet (50 mg total) by mouth 2 (two) times daily. 50 mg 2 hours before CT scan You may stop it after  the CT scan, unless specified otherwise by me. 07/22/20 10/20/20  Nigel Mormon, MD  Multiple Vitamin (MULTI-VITAMIN DAILY PO) Take 1 tablet by mouth daily.    [provider]  nitroGLYCERIN (NITROSTAT) 0.4 MG SL tablet Place 1 tablet (0.4 mg total) under the tongue every 5 (five) minutes as needed for chest pain. 07/22/20 10/20/20  Patwardhan, Reynold Bowen, MD  ondansetron (ZOFRAN) 4 MG tablet Take 1 tablet (4 mg total) by mouth every 6 (six) hours. 10/12/20   Couture, Cortni S, PA-C  triamcinolone cream (KENALOG) 0.5 %  12/28/18   [provider]  VITAMIN D, CHOLECALCIFEROL, PO Take by mouth.    [provider]    Allergies    Penicillins and Latex  Review of Systems   Review of Systems  Constitutional: Positive for activity change.  Respiratory: Positive for cough, chest tightness and shortness of breath.   Cardiovascular: Negative for chest pain.  Gastrointestinal: Negative for nausea and vomiting.  Neurological: Negative for dizziness.  All other systems reviewed and are negative.   Physical Exam Updated Vital Signs BP 109/68   Pulse 84   Temp 98.5 F (36.9 C) (Oral)   Resp (!) 24   SpO2 92%   Physical Exam Vitals and nursing note reviewed.  Constitutional:      Appearance: She is well-developed.  HENT:     Head: Normocephalic and atraumatic.  Eyes:     Pupils: Pupils are equal, round, and reactive to light.  Cardiovascular:     Rate and Rhythm: Normal rate and regular rhythm.     Heart sounds: Normal heart sounds. No murmur heard.   Pulmonary:     Effort: Pulmonary effort is normal. No respiratory distress.     Breath sounds: Rhonchi present. No decreased breath sounds, wheezing or rales.  Abdominal:     General: There is no distension.     Palpations: Abdomen is soft.     Tenderness: There is no abdominal tenderness. There is no guarding or rebound.  Musculoskeletal:     Cervical back: Neck supple.  Skin:    General: Skin is warm  and dry.  Neurological:     Mental Status: She is alert and oriented to person, place, and time.     ED Results / Procedures / Treatments   Labs (all labs ordered are listed, but only abnormal results are displayed) Labs Reviewed  CBC WITH DIFFERENTIAL/PLATELET - Abnormal; Notable for the following components:      Result Value   WBC  3.7 (*)    Hemoglobin 11.7 (*)    All other components within normal limits  COMPREHENSIVE METABOLIC PANEL - Abnormal; Notable for the following components:   Glucose, Bld 107 (*)    Creatinine, Ser 1.13 (*)    Calcium 8.2 (*)    Albumin 3.1 (*)    AST 59 (*)    ALT 52 (*)    Alkaline Phosphatase 34 (*)    All other components within normal limits  D-DIMER, QUANTITATIVE (NOT AT [facility]) - Abnormal; Notable for the following components:   D-Dimer, Quant 1.51 (*)    All other components within normal limits  LACTATE DEHYDROGENASE - Abnormal; Notable for the following components:   LDH 322 (*)    All other components within normal limits  FERRITIN - Abnormal; Notable for the following components:   Ferritin 329 (*)    All other components within normal limits  C-REACTIVE PROTEIN - Abnormal; Notable for the following components:   CRP 6.2 (*)    All other components within normal limits  LACTIC ACID, PLASMA  PROCALCITONIN  TRIGLYCERIDES  FIBRINOGEN  I-STAT BETA HCG BLOOD, ED ([facility], [facility], [facility] ONLY)    EKG EKG Interpretation  Date/Time:  Sunday October 16 2020 21:15:39 EST Ventricular Rate:  84 PR Interval:    QRS Duration: 87 QT Interval:  366 QTC Calculation: 433 R Axis:   39 Text Interpretation: Age not entered, assumed to be  44 years old for purpose of ECG interpretation Sinus rhythm No acute changes No significant change since last tracing Confirmed by Varney Biles ([id number]) on 10/16/2020 10:26:52 PM   Radiology DG Chest Port 1 View  Result Date: 10/16/2020 CLINICAL DATA:  Chest pain EXAM: PORTABLE CHEST 1 VIEW COMPARISON:  None.  FINDINGS: The heart size and mediastinal contours are within normal limits. Mildly hazy airspace opacity seen at the periphery of the left lung and the right mid lung. The visualized skeletal structures are unremarkable. IMPRESSION: Mild bilateral hazy airspace opacities which could be due to infectious etiology. Electronically Signed   By: Prudencio Pair M.D.   On: 10/16/2020 19:25    Procedures Procedures (including critical care time)  Medications Ordered in ED Medications  sodium chloride flush (NS) 0.9 % injection 3 mL (has no administration in time range)  sodium chloride flush (NS) 0.9 % injection 3 mL (has no administration in time range)  0.9 %  sodium chloride infusion (250 mLs Intravenous Not Given 10/16/20 2211)  albuterol (VENTOLIN HFA) 108 (90 Base) MCG/ACT inhaler 4 puff (4 puffs Inhalation Given 10/16/20 2210)  dexamethasone (DECADRON) injection 6 mg (6 mg Intravenous Given 10/16/20 2009)  sodium chloride 0.9 % bolus 500 mL (500 mLs Intravenous New Bag/Given 10/16/20 2204)    ED Course  I have reviewed the triage vital signs and the nursing notes.  Pertinent labs & imaging results that were available during my care of the patient were reviewed by me and considered in my medical decision making (see chart for details).  Clinical Course as of Oct 17 2251  Nancy Fetter Oct 16, 2020  2145 Patient's oxygen was discontinued.  There O2 sats have remained 92% or higher.  Her blood pressure surprisingly dropped to 88/54.  Patient denies any nausea vomiting but it does appear that she likely has reduced p.o. intake.  We will give her finder cc of normal saline.  P.o. challenge initiated.  If her BP remains low, then we will admit her.  There is no elevated creatinine associated  with the low blood pressure which is reassuring.  Anticipate discharge if patient continues to do well.    [AN]  2248 Patient reassessed x 2 times. Her BP improved to 947 systolic.  She has completed the oral challenge  and finder cc bolus. There was no hypoxia on room air.  D-dimer is only mildly elevated. At this time I do not think she needs a CT PE.  We have reassured her that the monitoring and the lab results do not show worsening of her COVID-19.  She is likely at the peak of her symptoms that she is about day 9 after her symptom onset.  She is stable for discharge with strict ER return precautions.   [AN]    Clinical Course User Index [AN] Varney Biles, MD   MDM Rules/Calculators/A&P                          Brenda Patton was evaluated in Emergency Department on 10/16/2020 for the symptoms described in the history of present illness. She was evaluated in the context of the global COVID-19 pandemic, which necessitated consideration that the patient might be at risk for infection with the SARS-CoV-2 virus that causes COVID-19. Institutional protocols and algorithms that pertain to the evaluation of patients at risk for COVID-19 are in a state of rapid change based on information released by regulatory bodies including the CDC and federal and state organizations. These policies and algorithms were followed during the patient's care in the ED.   44 year old female comes in a chief complaint of body aches and shortness of breath. Her O2 sats about 92% on room air.  She is slightly tachypneic without signs of respiratory distress or accessory muscle use.  Patient monitored in the ED for extended period of time.  We considered PE in the differential but deemed to be less likely.  Chest x-ray showing bilateral infiltrates consistent with COVID-19.  Suspicion is that her symptoms are because of progression of the inflammatory process that comes with the disease and not underlying PE.   Final Clinical Impression(s) / ED Diagnoses Final diagnoses:  [id number]    Rx / DC Orders ED Discharge Orders    None       Varney Biles, MD 10/16/20 2252

## 2020-10-16 NOTE — ED Triage Notes (Signed)
EMS reports from home, Pt states tested positive for Covid on 11/15, Pt states she is feeling worse, c/o SOB, generalized body pain since. Seen prior and given Tessalon Perles and Zofran at DC.  BP 132/72 HR 95 RR 18 Sp02 90 RA (88 upon exertion)

## 2021-02-09 ENCOUNTER — Encounter (HOSPITAL_BASED_OUTPATIENT_CLINIC_OR_DEPARTMENT_OTHER): Payer: Self-pay | Admitting: *Deleted

## 2021-02-09 ENCOUNTER — Emergency Department (HOSPITAL_BASED_OUTPATIENT_CLINIC_OR_DEPARTMENT_OTHER)
Admission: EM | Admit: 2021-02-09 | Discharge: 2021-02-09 | Disposition: A | Discharge status: Home / Self Care | Payer: 59 | Attending: Emergency Medicine | Admitting: Emergency Medicine

## 2021-02-09 ENCOUNTER — Other Ambulatory Visit: Payer: Self-pay

## 2021-02-09 DIAGNOSIS — Z91040 Latex allergy status: Secondary | ICD-10-CM | POA: Insufficient documentation

## 2021-02-09 DIAGNOSIS — M436 Torticollis: Secondary | ICD-10-CM | POA: Insufficient documentation

## 2021-02-09 MED ORDER — CYCLOBENZAPRINE HCL 10 MG PO TABS
10.0000 mg | ORAL_TABLET | Freq: Three times a day (TID) | ORAL | 0 refills | Status: DC | PRN
Start: 2021-02-09 — End: 2021-10-04

## 2021-02-09 MED ORDER — HYDROCODONE-ACETAMINOPHEN 5-325 MG PO TABS
1.0000 | ORAL_TABLET | Freq: Four times a day (QID) | ORAL | 0 refills | Status: DC | PRN
Start: 2021-02-09 — End: 2021-10-04

## 2021-02-09 NOTE — Discharge Instructions (Addendum)
Take the Flexeril as a muscle relaxer.  As we discussed it is really a pseudomuscle relaxer.  Supplement with the hydrocodone as needed for additional pain relief.  Work note provided to be out of work until Monday.  Would expect improvement over the next few days.  Follow-up with your doctor if not better by Monday.

## 2021-02-09 NOTE — ED Provider Notes (Signed)
[facility] EMERGENCY DEPARTMENT Provider Note   CSN: [id number] Arrival date & time: 02/09/21  1850     History Chief Complaint  Patient presents with  . Torticollis    Brenda Patton is a 45 y.o. female.  Patient awoke this morning with a lot of pain in the right side of her neck.  Not able to move her head left or right.  Got worse throughout the day.  Patient with no prior history of this in the past.        Past Medical History:  Diagnosis Date  . Anemia   . Anxiety   . Depression   . Endometriosis   . Fibroid   . GERD (gastroesophageal reflux disease)   . HPV in female   . Migraines   . Thyroid disease     Patient Active Problem List   Diagnosis Date Noted  . Angina pectoris ([facility]) 07/23/2020  . Anxiety 07/23/2020  . Exertional dyspnea 07/23/2020    Past Surgical History:  Procedure Laterality Date  . BUNIONECTOMY Right   . CESAREAN SECTION    . DILATION AND CURETTAGE OF UTERUS    . ECTOPIC PREGNANCY SURGERY    . ORIF ULNAR FRACTURE Left 04/20/2016   Procedure: OPEN REDUCTION INTERNAL FIXATION (ORIF) LEFT ULNAR FRACTURE;  Surgeon: Charlotte Crumb, MD;  Location: [facility];  Service: Orthopedics;  Laterality: Left;     OB History    Gravida  4   Para  2   Term  0   Preterm  0   AB  2   Living  2     SAB  1   IAB  0   Ectopic  1   Multiple  0   Live Births  0           Family History  Problem Relation Age of Onset  . Diabetes Mother   . Hypertension Mother   . Heart disease Mother   . Pancreatic cancer Mother   . Heart attack Maternal Aunt   . Diabetes Maternal Aunt   . Congestive Heart Failure Maternal Aunt   . Ovarian cancer Maternal Grandmother     Social History   Tobacco Use  . Smoking status: Never Smoker  . Smokeless tobacco: Never Used  Vaping Use  . Vaping Use: Never used  Substance Use Topics  . Alcohol use: No  . Drug use: No    Home Medications Prior to  Admission medications   Medication Sig Start Date End Date Taking? Authorizing Provider  albuterol (VENTOLIN HFA) 108 (90 Base) MCG/ACT inhaler Inhale into the lungs. 05/10/20  Yes [provider]  cyclobenzaprine (FLEXERIL) 10 MG tablet Take 1 tablet (10 mg total) by mouth 3 (three) times daily as needed for muscle spasms. 02/09/21  Yes Fredia Sorrow, MD  HYDROcodone-acetaminophen (NORCO/VICODIN) 5-325 MG tablet Take 1 tablet by mouth every 6 (six) hours as needed for moderate pain. 02/09/21  Yes Fredia Sorrow, MD  Multiple Vitamin (MULTI-VITAMIN DAILY PO) Take 1 tablet by mouth daily.   Yes [provider]  pantoprazole (PROTONIX) 40 MG tablet Take 40 mg by mouth daily.   Yes [provider]  sucralfate (CARAFATE) 1 g tablet Take 1 g by mouth 4 (four) times daily -  with meals and at bedtime.   Yes [provider]  ascorbic acid (VITAMIN C) 500 MG tablet Take by mouth.    [provider]  aspirin EC 81 MG  tablet Take 1 tablet (81 mg total) by mouth daily. Swallow whole. 07/22/20   Patwardhan, Reynold Bowen, MD  Biotin w/ Vitamins C & E (HAIR/SKIN/NAILS PO) Take by mouth.    [provider]  clobetasol (TEMOVATE) 0.05 % external solution Apply topically. 07/02/20   [provider]  cyanocobalamin 1000 MCG tablet Take by mouth.    [provider]  diazepam (VALIUM) 2 MG tablet Take 1 tablet (2 mg total) by mouth every 6 (six) hours as needed for anxiety. 07/22/20   Patwardhan, Reynold Bowen, MD  fluticasone (FLONASE) 50 MCG/ACT nasal spray daily as needed. 05/10/20 05/10/21  [provider]  guaiFENesin (MUCINEX) 600 MG 12 hr tablet Take 1 tablet (600 mg total) by mouth 2 (two) times daily. 10/12/20   Couture, Cortni S, PA-C  hydrocortisone 2.5 % ointment Apply topically. 07/03/20   [provider]  ketoconazole (NIZORAL) 2 % shampoo Apply 1 application topically 2 (two) times a week. 07/02/20   [provider]   metoprolol tartrate (LOPRESSOR) 50 MG tablet Take 1 tablet (50 mg total) by mouth 2 (two) times daily. 50 mg 2 hours before CT scan You may stop it after the CT scan, unless specified otherwise by me. 07/22/20 10/20/20  Patwardhan, Reynold Bowen, MD  nitroGLYCERIN (NITROSTAT) 0.4 MG SL tablet Place 1 tablet (0.4 mg total) under the tongue every 5 (five) minutes as needed for chest pain. 07/22/20 10/20/20  Patwardhan, Reynold Bowen, MD  ondansetron (ZOFRAN) 4 MG tablet Take 1 tablet (4 mg total) by mouth every 6 (six) hours. 10/12/20   Couture, Cortni S, PA-C  triamcinolone cream (KENALOG) 0.5 %  12/28/18   [provider]  VITAMIN D, CHOLECALCIFEROL, PO Take by mouth.    [provider]    Allergies    Penicillins and Latex  Review of Systems   Review of Systems  Constitutional: Negative for chills and fever.  HENT: Negative for congestion, rhinorrhea, sore throat and trouble swallowing.   Eyes: Negative for visual disturbance.  Respiratory: Positive for shortness of breath. Negative for cough.   Cardiovascular: Negative for chest pain and leg swelling.  Gastrointestinal: Negative for abdominal pain, diarrhea, nausea and vomiting.  Genitourinary: Negative for dysuria.  Musculoskeletal: Positive for neck pain. Negative for back pain.  Skin: Negative for rash.  Neurological: Negative for dizziness, light-headedness and headaches.  Hematological: Does not bruise/bleed easily.  Psychiatric/Behavioral: Negative for confusion.    Physical Exam Updated Vital Signs BP 110/83 (BP Location: Right Arm)   Pulse 93   Temp 98.8 F (37.1 C) (Oral)   Resp 20   Ht 1.575 m (5\' 2" )   Wt 83.8 kg   SpO2 100%   BMI 33.78 kg/m   Physical Exam Vitals and nursing note reviewed.  Constitutional:      General: She is not in acute distress.    Appearance: Normal appearance. She is well-developed.  HENT:     Head: Normocephalic and atraumatic.  Eyes:     Extraocular Movements: Extraocular  movements intact.     Conjunctiva/sclera: Conjunctivae normal.     Pupils: Pupils are equal, round, and reactive to light.  Neck:     Comments: Patient with muscle spasm on right side of the neck.  Unable to move her head left or right. Cardiovascular:     Rate and Rhythm: Normal rate and regular rhythm.     Heart sounds: No murmur heard.   Pulmonary:     Effort: Pulmonary effort is  normal. No respiratory distress.     Breath sounds: Normal breath sounds.  Abdominal:     Palpations: Abdomen is soft.     Tenderness: There is no abdominal tenderness.  Skin:    General: Skin is warm and dry.     Capillary Refill: Capillary refill takes less than 2 seconds.  Neurological:     General: No focal deficit present.     Mental Status: She is alert and oriented to person, place, and time.     Cranial Nerves: No cranial nerve deficit.     Sensory: No sensory deficit.     Motor: No weakness.     ED Results / Procedures / Treatments   Labs (all labs ordered are listed, but only abnormal results are displayed) Labs Reviewed - No data to display  EKG EKG Interpretation  Date/Time:  Thursday February 09 2021 19:06:19 EDT Ventricular Rate:  88 PR Interval:  152 QRS Duration: 82 QT Interval:  360 QTC Calculation: 435 R Axis:   81 Text Interpretation: Normal sinus rhythm Normal ECG Confirmed by Fredia Sorrow [phone]) on 02/09/2021 7:08:23 PM   Radiology No results found.  Procedures Procedures     Medications Ordered in ED Medications - No data to display  ED Course  I have reviewed the triage vital signs and the nursing notes.  Pertinent labs & imaging results that were available during my care of the patient were reviewed by me and considered in my medical decision making (see chart for details).    MDM Rules/Calculators/A&P                          Patient's presentation classic for torticollis.  Woke up with right-sided neck spasm.  Not able to move right.  Patient  talking in complete sentences.  Patient felt that there was some difficulty breathing but oxygen saturations are 100%.  Respiratory rate is 20.  Heart rate 93 temp 98.8.  Blood pressure 110/83.  Patient is in no respiratory distress.  Patient lives in the [place] area.  Will treat with Flexeril and hydrocodone.  Patient has a gastric ulcer problems.  So is not a candidate for nonsteroidals.  Patient's primary care doctor is in this area since she works here.  We will take her out of work until Monday Monday.  And have her follow-up with a primary care doctor if she not improved.    Final Clinical Impression(s) / ED Diagnoses Final diagnoses:  Torticollis, acute    Rx / DC Orders ED Discharge Orders         Ordered    cyclobenzaprine (FLEXERIL) 10 MG tablet  3 times daily PRN        02/09/21 2052    HYDROcodone-acetaminophen (NORCO/VICODIN) 5-325 MG tablet  Every 6 hours PRN        02/09/21 2052           Fredia Sorrow, MD 02/09/21 2059

## 2021-02-09 NOTE — ED Triage Notes (Signed)
Pain in the right side of her neck. Limited motion.

## 2021-09-15 ENCOUNTER — Ambulatory Visit: Payer: BLUE CROSS/BLUE SHIELD | Admitting: Student

## 2021-10-04 ENCOUNTER — Encounter: Payer: Self-pay | Admitting: Cardiology

## 2021-10-04 ENCOUNTER — Other Ambulatory Visit: Payer: Self-pay

## 2021-10-04 ENCOUNTER — Ambulatory Visit: Payer: BLUE CROSS/BLUE SHIELD | Admitting: Cardiology

## 2021-10-04 VITALS — BP 127/76 | HR 83 | Temp 97.8°F | Resp 16 | Ht 62.0 in | Wt 200.0 lb

## 2021-10-04 DIAGNOSIS — R072 Precordial pain: Secondary | ICD-10-CM

## 2021-10-04 DIAGNOSIS — R0789 Other chest pain: Secondary | ICD-10-CM

## 2021-10-04 NOTE — Progress Notes (Signed)
Patient referred by Jackie Plum, MD for chest pain  Subjective:   Brenda Patton, female    DOB: [birth year], 45 y.o.   MRN: [id number]   Chief Complaint  Patient presents with   Chest Pain   Dizziness   Fatigue   Follow-up     HPI  45 y/o [place] American female with obesity, mild dyslipidemia, family h/o early CAD, with chest pain, exertional dyspnea   Patient continues to have non-exertional, central and left sided pain with occasional shortness of breath. She is exercising regularly now at [employer], without any significant difficulty. She was previously recommended CTA for definitive coronary evaluation, but is afraid to undergo this test.  Current Outpatient Medications on File Prior to Visit  Medication Sig Dispense Refill   albuterol (VENTOLIN HFA) 108 (90 Base) MCG/ACT inhaler Inhale into the lungs.     ascorbic acid (VITAMIN C) 500 MG tablet Take by mouth.     aspirin EC 81 MG tablet Take 1 tablet (81 mg total) by mouth daily. Swallow whole. 30 tablet 1   Biotin w/ Vitamins C & E (HAIR/SKIN/NAILS PO) Take by mouth.     clobetasol (TEMOVATE) 0.05 % external solution Apply topically.     cyanocobalamin 1000 MCG tablet Take by mouth.     cyclobenzaprine (FLEXERIL) 10 MG tablet Take 1 tablet (10 mg total) by mouth 3 (three) times daily as needed for muscle spasms. 20 tablet 0   diazepam (VALIUM) 2 MG tablet Take 1 tablet (2 mg total) by mouth every 6 (six) hours as needed for anxiety. 2 tablet 0   fluticasone (FLONASE) 50 MCG/ACT nasal spray daily as needed.     guaiFENesin (MUCINEX) 600 MG 12 hr tablet Take 1 tablet (600 mg total) by mouth 2 (two) times daily. 6 tablet 0   HYDROcodone-acetaminophen (NORCO/VICODIN) 5-325 MG tablet Take 1 tablet by mouth every 6 (six) hours as needed for moderate pain. 12 tablet 0   hydrocortisone 2.5 % ointment Apply topically.     ketoconazole (NIZORAL) 2 % shampoo Apply 1 application topically 2 (two) times a week.      metoprolol tartrate (LOPRESSOR) 50 MG tablet Take 1 tablet (50 mg total) by mouth 2 (two) times daily. 50 mg 2 hours before CT scan You may stop it after the CT scan, unless specified otherwise by me. 2 tablet 0   Multiple Vitamin (MULTI-VITAMIN DAILY PO) Take 1 tablet by mouth daily.     nitroGLYCERIN (NITROSTAT) 0.4 MG SL tablet Place 1 tablet (0.4 mg total) under the tongue every 5 (five) minutes as needed for chest pain. 30 tablet 1   ondansetron (ZOFRAN) 4 MG tablet Take 1 tablet (4 mg total) by mouth every 6 (six) hours. 12 tablet 0   pantoprazole (PROTONIX) 40 MG tablet Take 40 mg by mouth daily.     sucralfate (CARAFATE) 1 g tablet Take 1 g by mouth 4 (four) times daily -  with meals and at bedtime.     triamcinolone cream (KENALOG) 0.5 %      VITAMIN D, CHOLECALCIFEROL, PO Take by mouth.     No current facility-administered medications on file prior to visit.    Cardiovascular and other pertinent studies:  EKG 10/04/2021: Sinus rhythm 82 bpm Normal EKG  Echocardiogram 06/03/2020: LVEF 55-60%. No significant valvular abnormality   Recent labs: 09/2020: Glucose 107, BUN/Cr 11/1.13. EGFR >60. Na/K 137/3.8. AST/ALT 59/52. Albumin 3.1. Rest of the CMP normal LDH 322 high H/H 11.7/36.3.  MCV 91. Platelets 193  05/14/2020: 05/31/2020 Glucose 87, BUN/Cr 14/0.89. EGFR 91. Na/K 138/4.0. Alkaline Phosphatase: 45 H/H 11.9/37.2. MCV 92. Platelets 292 HbA1C 5.6% Chol 204, TG 40, HDL 90, LDL 106 TSH 0.37 normal    Review of Systems  Cardiovascular:  Positive for dyspnea on exertion. Negative for chest pain, leg swelling, palpitations and syncope.  Respiratory:  Positive for shortness of breath.         Vitals:   10/04/21 1026 10/04/21 1035  BP: 131/81 127/76  Pulse: 81 83  Resp: 16   Temp: 97.8 F (36.6 C)   SpO2: 95% 96%     Body mass index is 36.58 kg/m. Filed Weights   10/04/21 1026  Weight: 200 lb (90.7 kg)     Objective:   Physical Exam Vitals and  nursing note reviewed.  Constitutional:      General: She is not in acute distress. Neck:     Vascular: No JVD.  Cardiovascular:     Rate and Rhythm: Normal rate and regular rhythm.     Heart sounds: Normal heart sounds. No murmur heard. Pulmonary:     Effort: Pulmonary effort is normal.     Breath sounds: Normal breath sounds. No wheezing or rales.           Assessment & Recommendations:   45 y/o [place] American female with obesity, mild dyslipidemia, family h/o early CAD, with chest pain, exertional dyspnea   Chest pain: Atypical. Does not want to do CTA. Recommend exercise treadmill stress test and CT cardiac scoring. Check lipid panel.  F/u as needed, unless significant abnormalities noted on the above testing.     Nigel Mormon, MD Pager: [phone] Office: [phone]

## 2021-10-05 ENCOUNTER — Encounter: Payer: Self-pay | Admitting: Cardiology

## 2021-10-05 DIAGNOSIS — R0789 Other chest pain: Secondary | ICD-10-CM | POA: Insufficient documentation

## 2021-11-15 ENCOUNTER — Emergency Department (HOSPITAL_BASED_OUTPATIENT_CLINIC_OR_DEPARTMENT_OTHER): Payer: BLUE CROSS/BLUE SHIELD

## 2021-11-15 ENCOUNTER — Other Ambulatory Visit: Payer: Self-pay

## 2021-11-15 ENCOUNTER — Encounter (HOSPITAL_BASED_OUTPATIENT_CLINIC_OR_DEPARTMENT_OTHER): Payer: Self-pay | Admitting: Emergency Medicine

## 2021-11-15 ENCOUNTER — Emergency Department (HOSPITAL_BASED_OUTPATIENT_CLINIC_OR_DEPARTMENT_OTHER)
Admission: EM | Admit: 2021-11-15 | Discharge: 2021-11-15 | Disposition: A | Discharge status: Home / Self Care | Payer: BLUE CROSS/BLUE SHIELD | Attending: Emergency Medicine | Admitting: Emergency Medicine

## 2021-11-15 DIAGNOSIS — K5900 Constipation, unspecified: Secondary | ICD-10-CM

## 2021-11-15 DIAGNOSIS — R1032 Left lower quadrant pain: Secondary | ICD-10-CM | POA: Diagnosis not present

## 2021-11-15 DIAGNOSIS — R109 Unspecified abdominal pain: Secondary | ICD-10-CM

## 2021-11-15 LAB — COMPREHENSIVE METABOLIC PANEL
ALT: 22 U/L (ref 0–44)
AST: 27 U/L (ref 15–41)
Albumin: 3.3 g/dL — ABNORMAL LOW (ref 3.5–5.0)
Alkaline Phosphatase: 46 U/L (ref 38–126)
Anion gap: 7 (ref 5–15)
BUN: 15 mg/dL (ref 6–20)
CO2: 25 mmol/L (ref 22–32)
Calcium: 9.1 mg/dL (ref 8.9–10.3)
Chloride: 102 mmol/L (ref 98–111)
Creatinine, Ser: 1.01 mg/dL — ABNORMAL HIGH (ref 0.44–1.00)
GFR, Estimated: >60 mL/min (ref >60)
Glucose, Bld: 97 mg/dL (ref 70–99)
Potassium: 3.9 mmol/L (ref 3.5–5.1)
Sodium: 134 mmol/L — ABNORMAL LOW (ref 135–145)
Total Bilirubin: 0.5 mg/dL (ref 0.3–1.2)
Total Protein: 8.4 g/dL — ABNORMAL HIGH (ref 6.5–8.1)

## 2021-11-15 LAB — URINALYSIS, ROUTINE W REFLEX MICROSCOPIC
Bilirubin Urine: NEGATIVE
Glucose, UA: NEGATIVE mg/dL
Hgb urine dipstick: NEGATIVE
Ketones, ur: NEGATIVE mg/dL
Leukocytes,Ua: NEGATIVE
Nitrite: NEGATIVE
Protein, ur: NEGATIVE mg/dL
Specific Gravity, Urine: 1.015 (ref 1.005–1.030)
pH: 7 (ref 5.0–8.0)

## 2021-11-15 LAB — CBC WITH DIFFERENTIAL/PLATELET
Abs Immature Granulocytes: 0.01 10*3/uL (ref 0.00–0.07)
Basophils Absolute: 0 10*3/uL (ref 0.0–0.1)
Basophils Relative: 1 %
Eosinophils Absolute: 0.3 10*3/uL (ref 0.0–0.5)
Eosinophils Relative: 8 %
HCT: 35.8 % — ABNORMAL LOW (ref 36.0–46.0)
Hemoglobin: 11.8 g/dL — ABNORMAL LOW (ref 12.0–15.0)
Immature Granulocytes: 0 %
Lymphocytes Relative: 30 %
Lymphs Abs: 1.2 10*3/uL (ref 0.7–4.0)
MCH: 30.3 pg (ref 26.0–34.0)
MCHC: 33 g/dL (ref 30.0–36.0)
MCV: 91.8 fL (ref 80.0–100.0)
Monocytes Absolute: 0.6 10*3/uL (ref 0.1–1.0)
Monocytes Relative: 15 %
Neutro Abs: 1.8 10*3/uL (ref 1.7–7.7)
Neutrophils Relative %: 46 %
Platelets: 225 10*3/uL (ref 150–400)
RBC: 3.9 MIL/uL (ref 3.87–5.11)
RDW: 13.4 % (ref 11.5–15.5)
WBC: 3.9 10*3/uL — ABNORMAL LOW (ref 4.0–10.5)
nRBC: 0 % (ref 0.0–0.2)

## 2021-11-15 LAB — LIPASE, BLOOD: Lipase: 38 U/L (ref 11–51)

## 2021-11-15 LAB — PREGNANCY, URINE: Preg Test, Ur: NEGATIVE

## 2021-11-15 MED ORDER — ACETAMINOPHEN 325 MG PO TABS
650.0000 mg | ORAL_TABLET | Freq: Once | ORAL | Status: AC
  Administered 2021-11-15: 09:00:00 650 mg via ORAL
  Filled 2021-11-15: qty 2

## 2021-11-15 NOTE — ED Triage Notes (Signed)
Pt reports LLQ pain since this am. Pt reports she has been urinating more frequently and has felt urgency. No n/v/d.

## 2021-11-15 NOTE — ED Provider Notes (Signed)
[facility] EMERGENCY DEPARTMENT Provider Note   CSN: [id number] Arrival date & time: 11/15/21  0818     History Chief Complaint  Patient presents with   Abdominal Pain    Brenda Patton is a 45 y.o. female.  The history is provided by the patient.  Abdominal Pain Pain location:  LLQ Pain quality: aching   Pain radiates to:  Does not radiate Pain severity:  Mild Onset quality:  Gradual Timing:  Constant Progression:  Unchanged Chronicity:  New Context: not sick contacts   Relieved by:  Nothing Worsened by:  Nothing Associated symptoms: dysuria   Associated symptoms: no anorexia, no belching, no chest pain, no chills, no constipation, no cough, no diarrhea, no fatigue, no fever, no hematemesis, no hematochezia, no hematuria, no melena, no nausea, no shortness of breath, no sore throat, no vaginal bleeding, no vaginal discharge and no vomiting       Past Medical History:  Diagnosis Date   Anemia    Anxiety    Depression    Endometriosis    Fibroid    GERD (gastroesophageal reflux disease)    HPV in female    Migraines    Thyroid disease     Patient Active Problem List   Diagnosis Date Noted   Atypical chest pain 10/05/2021   Precordial pain 10/04/2021   Angina pectoris ([place]) 07/23/2020   Anxiety 07/23/2020   Exertional dyspnea 07/23/2020    Past Surgical History:  Procedure Laterality Date   BUNIONECTOMY Right    CESAREAN SECTION     DILATION AND CURETTAGE OF UTERUS     ECTOPIC PREGNANCY SURGERY     ORIF ULNAR FRACTURE Left 04/20/2016   Procedure: OPEN REDUCTION INTERNAL FIXATION (ORIF) LEFT ULNAR FRACTURE;  Surgeon: Charlotte Crumb, MD;  Location: [facility];  Service: Orthopedics;  Laterality: Left;     OB History     Gravida  4   Para  2   Term  0   Preterm  0   AB  2   Living  2      SAB  1   IAB  0   Ectopic  1   Multiple  0   Live Births  0           Family History  Problem Relation  Age of Onset   Diabetes Mother    Hypertension Mother    Heart disease Mother    Pancreatic cancer Mother    Heart attack Maternal Aunt    Diabetes Maternal Aunt    Congestive Heart Failure Maternal Aunt    Ovarian cancer Maternal Grandmother     Social History   Tobacco Use   Smoking status: Never   Smokeless tobacco: Never  Vaping Use   Vaping Use: Never used  Substance Use Topics   Alcohol use: No   Drug use: No    Home Medications Prior to Admission medications   Medication Sig Start Date End Date Taking? Authorizing Provider  albuterol (VENTOLIN HFA) 108 (90 Base) MCG/ACT inhaler Inhale into the lungs. 05/10/20   [provider]  ascorbic acid (VITAMIN C) 500 MG tablet Take by mouth.    [provider]  Biotin w/ Vitamins C & E (HAIR/SKIN/NAILS PO) Take by mouth.    [provider]  clobetasol (TEMOVATE) 0.05 % external solution Apply topically. 07/02/20   [provider]  cyanocobalamin 1000 MCG tablet Take by mouth.    [provider]  fluticasone (FLONASE) 50 MCG/ACT nasal spray daily as needed. 05/10/20 05/10/21  [provider]  guaiFENesin (MUCINEX) 600 MG 12 hr tablet Take 1 tablet (600 mg total) by mouth 2 (two) times daily. 10/12/20   Couture, Cortni S, PA-C  hydrocortisone 2.5 % ointment Apply topically. 07/03/20   [provider]  ketoconazole (NIZORAL) 2 % shampoo Apply 1 application topically 2 (two) times a week. 07/02/20   [provider]  Multiple Vitamin (MULTI-VITAMIN DAILY PO) Take 1 tablet by mouth daily.    [provider]  nitroGLYCERIN (NITROSTAT) 0.4 MG SL tablet Place 1 tablet (0.4 mg total) under the tongue every 5 (five) minutes as needed for chest pain. 07/22/20 10/20/20  Nigel Mormon, MD  triamcinolone cream (KENALOG) 0.5 %  12/28/18   [provider]  VITAMIN D, CHOLECALCIFEROL, PO Take by mouth.    [provider]    Allergies    Penicillins and  Latex  Review of Systems   Review of Systems  Constitutional:  Negative for chills, fatigue and fever.  HENT:  Negative for ear pain and sore throat.   Eyes:  Negative for pain and visual disturbance.  Respiratory:  Negative for cough and shortness of breath.   Cardiovascular:  Negative for chest pain and palpitations.  Gastrointestinal:  Positive for abdominal pain. Negative for anorexia, constipation, diarrhea, hematemesis, hematochezia, melena, nausea and vomiting.  Genitourinary:  Positive for dysuria and frequency. Negative for hematuria, vaginal bleeding and vaginal discharge.  Musculoskeletal:  Negative for arthralgias and back pain.  Skin:  Negative for color change and rash.  Neurological:  Negative for seizures and syncope.  All other systems reviewed and are negative.  Physical Exam Updated Vital Signs BP 118/81    Pulse 73    Temp 98.7 F (37.1 C)    Resp 16    LMP 10/30/2021    SpO2 100%   Physical Exam Vitals and nursing note reviewed.  Constitutional:      General: She is not in acute distress.    Appearance: She is well-developed. She is not ill-appearing.  HENT:     Head: Normocephalic and atraumatic.     Mouth/Throat:     Mouth: Mucous membranes are moist.  Eyes:     Extraocular Movements: Extraocular movements intact.     Conjunctiva/sclera: Conjunctivae normal.     Pupils: Pupils are equal, round, and reactive to light.  Cardiovascular:     Rate and Rhythm: Normal rate and regular rhythm.     Heart sounds: Normal heart sounds. No murmur heard. Pulmonary:     Effort: Pulmonary effort is normal. No respiratory distress.     Breath sounds: Normal breath sounds.  Abdominal:     General: Abdomen is flat.     Palpations: Abdomen is soft.     Tenderness: There is abdominal tenderness in the left lower quadrant.     Hernia: No hernia is present.  Musculoskeletal:        General: No swelling.     Cervical back: Neck supple.  Skin:    General: Skin is warm  and dry.     Capillary Refill: Capillary refill takes less than 2 seconds.  Neurological:     Mental Status: She is alert.  Psychiatric:        Mood and Affect: Mood normal.    ED Results / Procedures / Treatments   Labs (all labs ordered are listed, but only abnormal results are displayed) Labs Reviewed  CBC WITH DIFFERENTIAL/PLATELET - Abnormal; Notable for the following components:      Result Value   WBC 3.9 (*)    Hemoglobin 11.8 (*)    HCT 35.8 (*)    All other components within normal limits  COMPREHENSIVE METABOLIC PANEL - Abnormal; Notable for the following components:   Sodium 134 (*)    Creatinine, Ser 1.01 (*)    Total Protein 8.4 (*)    Albumin 3.3 (*)    All other components within normal limits  URINALYSIS, ROUTINE W REFLEX MICROSCOPIC  PREGNANCY, URINE  LIPASE, BLOOD    EKG None  Radiology CT Renal Stone Study  Result Date: 11/15/2021 CLINICAL DATA:  LEFT flank and LEFT lower quadrant pain since this morning, urinating more frequently, urinary urgency EXAM: CT ABDOMEN AND PELVIS WITHOUT CONTRAST TECHNIQUE: Multidetector CT imaging of the abdomen and pelvis was performed following the standard protocol without IV contrast. COMPARISON:  05/21/2013 FINDINGS: Lower chest: Lung bases clear Hepatobiliary: Gallbladder and liver normal appearance Pancreas: Normal appearance Spleen: Normal appearance Adrenals/Urinary Tract: Adrenal glands, kidneys, ureters, and bladder normal appearance. Specifically no urinary tract calcification or dilatation. Stomach/Bowel: Slightly increased stool in distal half of colon. Stomach and bowel loops otherwise normal appearance. Normal appendix. Vascular/Lymphatic: Aorta normal caliber.  No adenopathy. Reproductive: Unremarkable uterus and ovaries Other: No free air or free fluid. No acute inflammatory process or hernia. 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 No other osseous abnormalities. IMPRESSION:  No urinary tract abnormalities identified. Slightly increased stool in distal half of colon. 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 Otherwise negative exam. Electronically Signed   By: Lavonia Dana M.D.   On: 11/15/2021 09:08    Procedures Procedures   Medications Ordered in ED Medications  acetaminophen (TYLENOL) tablet 650 mg (650 mg Oral Given 11/15/21 6010)    ED Course  I have reviewed the triage vital signs and the nursing notes.  Pertinent labs & imaging results that were available during my care of the patient were reviewed by me and considered in my medical decision making (see chart for details).    MDM Rules/Calculators/A&P                          Lovenia Kim is here with left-sided abdominal pain.  Normal vitals.  No fever.  History of fibroids.  Pain for the last 2 days.  Some urinary frequency.  History of may be kidney stones as well.  We will check basic labs and get a CT scan of the abdomen and pelvis to evaluate for kidney stone versus diverticulitis versus less likely ovarian cyst.  She does have history of fibroids.  This could be something muscular as well.  Lab work is overall unremarkable.  No UTI.  No kidney stone.  No diverticulitis.  There is some moderate stool burden.  Overall suspect that constipation causing discomfort.  Recommend MiraLAX.  Discharged in good condition.  This chart was dictated using voice recognition software.  Despite best efforts to proofread,  errors can occur which can change the documentation meaning.      Final Clinical Impression(s) / ED Diagnoses Final diagnoses:  Abdominal pain, unspecified abdominal location  Constipation, unspecified constipation type    Rx / DC Orders ED Discharge Orders     None        Lennice Sites, DO 11/15/21 9323

## 2021-11-15 NOTE — Discharge Instructions (Signed)
Recommend buying over-the-counter MiraLAX to use as needed for constipation.

## 2022-02-12 ENCOUNTER — Other Ambulatory Visit: Payer: Self-pay | Admitting: Internal Medicine

## 2022-02-12 DIAGNOSIS — Z1231 Encounter for screening mammogram for malignant neoplasm of breast: Secondary | ICD-10-CM

## 2022-02-23 ENCOUNTER — Ambulatory Visit: Payer: BLUE CROSS/BLUE SHIELD

## 2022-02-23 DIAGNOSIS — R0789 Other chest pain: Secondary | ICD-10-CM

## 2022-03-02 ENCOUNTER — Emergency Department (HOSPITAL_BASED_OUTPATIENT_CLINIC_OR_DEPARTMENT_OTHER): Payer: BLUE CROSS/BLUE SHIELD

## 2022-03-02 ENCOUNTER — Encounter (HOSPITAL_BASED_OUTPATIENT_CLINIC_OR_DEPARTMENT_OTHER): Payer: Self-pay | Admitting: Urology

## 2022-03-02 ENCOUNTER — Other Ambulatory Visit: Payer: Self-pay

## 2022-03-02 ENCOUNTER — Emergency Department (HOSPITAL_BASED_OUTPATIENT_CLINIC_OR_DEPARTMENT_OTHER)
Admission: EM | Admit: 2022-03-02 | Discharge: 2022-03-02 | Disposition: A | Discharge status: Home / Self Care | Payer: BLUE CROSS/BLUE SHIELD | Attending: Emergency Medicine | Admitting: Emergency Medicine

## 2022-03-02 DIAGNOSIS — R0602 Shortness of breath: Secondary | ICD-10-CM | POA: Insufficient documentation

## 2022-03-02 DIAGNOSIS — R0789 Other chest pain: Secondary | ICD-10-CM | POA: Diagnosis present

## 2022-03-02 DIAGNOSIS — R079 Chest pain, unspecified: Secondary | ICD-10-CM

## 2022-03-02 LAB — CBC WITH DIFFERENTIAL/PLATELET
Abs Immature Granulocytes: 0.02 10*3/uL (ref 0.00–0.07)
Basophils Absolute: 0 10*3/uL (ref 0.0–0.1)
Basophils Relative: 1 %
Eosinophils Absolute: 0.2 10*3/uL (ref 0.0–0.5)
Eosinophils Relative: 4 %
HCT: 35.5 % — ABNORMAL LOW (ref 36.0–46.0)
Hemoglobin: 11.8 g/dL — ABNORMAL LOW (ref 12.0–15.0)
Immature Granulocytes: 0 %
Lymphocytes Relative: 31 %
Lymphs Abs: 1.8 10*3/uL (ref 0.7–4.0)
MCH: 30.8 pg (ref 26.0–34.0)
MCHC: 33.2 g/dL (ref 30.0–36.0)
MCV: 92.7 fL (ref 80.0–100.0)
Monocytes Absolute: 0.6 10*3/uL (ref 0.1–1.0)
Monocytes Relative: 10 %
Neutro Abs: 3.1 10*3/uL (ref 1.7–7.7)
Neutrophils Relative %: 54 %
Platelets: 236 10*3/uL (ref 150–400)
RBC: 3.83 MIL/uL — ABNORMAL LOW (ref 3.87–5.11)
RDW: 13.4 % (ref 11.5–15.5)
WBC: 5.8 10*3/uL (ref 4.0–10.5)
nRBC: 0 % (ref 0.0–0.2)

## 2022-03-02 LAB — BASIC METABOLIC PANEL
Anion gap: 5 (ref 5–15)
BUN: 19 mg/dL (ref 6–20)
CO2: 25 mmol/L (ref 22–32)
Calcium: 9 mg/dL (ref 8.9–10.3)
Chloride: 108 mmol/L (ref 98–111)
Creatinine, Ser: 1.07 mg/dL — ABNORMAL HIGH (ref 0.44–1.00)
GFR, Estimated: >60 mL/min (ref >60)
Glucose, Bld: 97 mg/dL (ref 70–99)
Potassium: 4.2 mmol/L (ref 3.5–5.1)
Sodium: 138 mmol/L (ref 135–145)

## 2022-03-02 LAB — TROPONIN I (HIGH SENSITIVITY)
Troponin I (High Sensitivity): <2 ng/L (ref <18)
Troponin I (High Sensitivity): 2 ng/L (ref <18)

## 2022-03-02 MED ORDER — ACETAMINOPHEN 500 MG PO TABS
1000.0000 mg | ORAL_TABLET | Freq: Once | ORAL | Status: AC
  Administered 2022-03-02: 1000 mg via ORAL
  Filled 2022-03-02: qty 2

## 2022-03-02 NOTE — Discharge Instructions (Signed)
Please follow-up with your primary care doctor to discuss your symptoms from today.  Come back to ER if you develop recurrent chest pain, difficulty breathing or other new concerning symptom. ?

## 2022-03-02 NOTE — ED Notes (Signed)
Written and verbal inst to pt  Verbalized an understanding  To home  

## 2022-03-02 NOTE — ED Notes (Signed)
Patient transported to X-ray 

## 2022-03-02 NOTE — ED Triage Notes (Signed)
Pt states got hot and sweaty and had chest tightness approx 30 min pTA ?State feels like weight sitting on whole body and body couldn't move ?States stress at work  ?States headache as well and numbness in fingers  ?Pt states " I feel like I am upset"  ?

## 2022-03-02 NOTE — ED Provider Notes (Signed)
?[facility] EMERGENCY DEPARTMENT ?Provider Note ? ? ?CSN: [id number] ?Arrival date & time: 03/02/22  1839 ? ?  ? ?History ? ?Chief Complaint  ?Patient presents with  ? Chest Pain  ? ? ?Brenda Patton is a 46 y.o. female.  Presented to the emergency department with concern for chest pain.  Patient states that she had abrupt onset of chest pain/tightness/shortness of breath.  Felt very anxious during the episode.  Symptoms lasted for a few minutes and then seem to slowly dissipate.  Now having no chest pain. ? ?She denies any history of coronary artery disease, DVT/PE history.   ? ?HPI ? ?  ? ?Home Medications ?Prior to Admission medications   ?Medication Sig Start Date End Date Taking? Authorizing Provider  ?albuterol (VENTOLIN HFA) 108 (90 Base) MCG/ACT inhaler Inhale into the lungs. 05/10/20   [provider]  ?ascorbic acid (VITAMIN C) 500 MG tablet Take by mouth.    [provider]  ?Biotin w/ Vitamins C & E (HAIR/SKIN/NAILS PO) Take by mouth.    [provider]  ?clobetasol (TEMOVATE) 0.05 % external solution Apply topically. 07/02/20   [provider]  ?cyanocobalamin 1000 MCG tablet Take by mouth.    [provider]  ?fluticasone (FLONASE) 50 MCG/ACT nasal spray daily as needed. 05/10/20 05/10/21  [provider]  ?guaiFENesin (MUCINEX) 600 MG 12 hr tablet Take 1 tablet (600 mg total) by mouth 2 (two) times daily. 10/12/20   Couture, Cortni S, PA-C  ?hydrocortisone 2.5 % ointment Apply topically. 07/03/20   [provider]  ?ketoconazole (NIZORAL) 2 % shampoo Apply 1 application topically 2 (two) times a week. 07/02/20   [provider]  ?Multiple Vitamin (MULTI-VITAMIN DAILY PO) Take 1 tablet by mouth daily.    [provider]  ?nitroGLYCERIN (NITROSTAT) 0.4 MG SL tablet Place 1 tablet (0.4 mg total) under the tongue every 5 (five) minutes as needed for chest pain. 07/22/20 10/20/20  Nigel Mormon, MD  ?triamcinolone  cream (KENALOG) 0.5 %  12/28/18   [provider]  ?VITAMIN D, CHOLECALCIFEROL, PO Take by mouth.    [provider]  ?   ? ?Allergies    ?Penicillins and Latex   ? ?Review of Systems   ?Review of Systems  ?Constitutional:  Negative for chills and fever.  ?HENT:  Negative for ear pain and sore throat.   ?Eyes:  Negative for pain and visual disturbance.  ?Respiratory:  Positive for chest tightness and shortness of breath. Negative for cough.   ?Cardiovascular:  Positive for chest pain. Negative for palpitations.  ?Gastrointestinal:  Negative for abdominal pain and vomiting.  ?Genitourinary:  Negative for dysuria and hematuria.  ?Musculoskeletal:  Negative for arthralgias and back pain.  ?Skin:  Negative for color change and rash.  ?Neurological:  Negative for seizures and syncope.  ?All other systems reviewed and are negative. ? ?Physical Exam ?Updated Vital Signs ?BP (!) 99/51 (BP Location: Left Arm)   Pulse 66   Temp 98.6 ?F (37 ?C) (Oral)   Resp 16   Ht '5\' 2"'$  (1.575 m)   Wt 90.7 kg   LMP 02/20/2022   SpO2 100%   BMI 36.57 kg/m?  ?Physical Exam ?Vitals and nursing note reviewed.  ?Constitutional:   ?   General: She is not in acute distress. ?   Appearance: She is well-developed.  ?HENT:  ?   Head: Normocephalic and atraumatic.  ?Eyes:  ?   Conjunctiva/sclera: Conjunctivae normal.  ?Cardiovascular:  ?  Rate and Rhythm: Normal rate and regular rhythm.  ?   Heart sounds: No murmur heard. ?Pulmonary:  ?   Effort: Pulmonary effort is normal. No respiratory distress.  ?   Breath sounds: Normal breath sounds.  ?Abdominal:  ?   Palpations: Abdomen is soft.  ?   Tenderness: There is no abdominal tenderness.  ?Musculoskeletal:     ?   General: No swelling.  ?   Cervical back: Neck supple.  ?Skin: ?   General: Skin is warm and dry.  ?   Capillary Refill: Capillary refill takes less than 2 seconds.  ?Neurological:  ?   Mental Status: She is alert.  ?Psychiatric:     ?   Mood and Affect: Mood normal.   ? ? ?ED Results / Procedures / Treatments   ?Labs ?(all labs ordered are listed, but only abnormal results are displayed) ?Labs Reviewed  ?CBC WITH DIFFERENTIAL/PLATELET - Abnormal; Notable for the following components:  ?    Result Value  ? RBC 3.83 (*)   ? Hemoglobin 11.8 (*)   ? HCT 35.5 (*)   ? All other components within normal limits  ?BASIC METABOLIC PANEL - Abnormal; Notable for the following components:  ? Creatinine, Ser 1.07 (*)   ? All other components within normal limits  ?TROPONIN I (HIGH SENSITIVITY)  ?TROPONIN I (HIGH SENSITIVITY)  ? ? ?EKG ?EKG Interpretation ? ?Date/Time:  Friday March 02 2022 18:50:23 EDT ?Ventricular Rate:  80 ?PR Interval:  158 ?QRS Duration: 102 ?QT Interval:  386 ?QTC Calculation: 446 ?R Axis:   35 ?Text Interpretation: Sinus rhythm Confirmed by Madalyn Rob [phone]) on 03/02/2022 7:15:16 PM ? ?Radiology ?No results found. ? ?Procedures ?Procedures  ? ? ?Medications Ordered in ED ?Medications  ?acetaminophen (TYLENOL) tablet 1,000 mg (1,000 mg Oral Given 03/02/22 1931)  ? ? ?ED Course/ Medical Decision Making/ A&P ?  ?                        ?Medical Decision Making ?Amount and/or Complexity of Data Reviewed ?Labs: ordered. ?Radiology: ordered. ? ?Risk ?OTC drugs. ? ? ?46 year old lady presenting to the emergency room with concern for episode of chest pain/tightness/shortness of breath.  When I initially evaluated patient, she was quite well-appearing in no acute distress and denied ongoing chest pain or shortness of breath.  Her vital signs were grossly within normal limits.  EKG did not have any acute ischemic change and troponin x2 was within normal limits.  Doubt ACS.  CXR was independently reviewed by myself.  No acute findings.  No evidence for pneumonia, pneumothorax or other acute pulmonary process.  No tachypnea, tachycardia, hypoxia, doubt PE.  No significant anemia, no electrolyte disturbance.  Due to lack of ongoing symptoms and reassuring work-up, feel patient  can be discharged and follow-up with her primary care doctor regarding this episode.  I reviewed return precautions and discharged patient. ? ? ? ?After the discussed management above, the patient was determined to be safe for discharge.  The patient was in agreement with this plan and all questions regarding their care were answered.  ED return precautions were discussed and the patient will return to the ED with any significant worsening of condition. ? ? ? ? ? ? ? ? ?Final Clinical Impression(s) / ED Diagnoses ?Final diagnoses:  ?Chest pain, unspecified type  ? ? ?Rx / DC Orders ?ED Discharge Orders   ? ? None  ? ?  ? ? ?  ?  Lucrezia Starch, MD ?03/04/22 2006 ? ?

## 2022-03-13 ENCOUNTER — Ambulatory Visit
Admission: RE | Admit: 2022-03-13 | Discharge: 2022-03-13 | Disposition: A | Discharge status: Home / Self Care | Payer: No Typology Code available for payment source | Source: Ambulatory Visit | Attending: Cardiology | Admitting: Cardiology

## 2022-03-13 DIAGNOSIS — R0789 Other chest pain: Secondary | ICD-10-CM

## 2022-03-19 ENCOUNTER — Encounter: Payer: Self-pay | Admitting: Cardiology

## 2022-03-19 NOTE — Telephone Encounter (Signed)
From pt

## 2022-03-22 ENCOUNTER — Encounter: Payer: Self-pay | Admitting: Cardiology

## 2022-03-22 ENCOUNTER — Ambulatory Visit: Payer: BLUE CROSS/BLUE SHIELD | Admitting: Cardiology

## 2022-03-22 VITALS — BP 120/80 | HR 80 | Temp 98.1°F | Resp 16 | Ht 62.0 in | Wt 217.2 lb

## 2022-03-22 DIAGNOSIS — R072 Precordial pain: Secondary | ICD-10-CM

## 2022-03-22 NOTE — Progress Notes (Signed)
? ? ?Patient referred by Benito Mccreedy, MD for chest pain ? ?Subjective:  ? ?Brenda Patton, female    DOB: 1976-10-20, 46 y.o.   MRN: [id number] ? ? ?Chief Complaint  ?Patient presents with  ? Chest Pain  ? ? ? ?HPI ? ?46 y/o [place] American female with obesity, mild dyslipidemia, family h/o early CAD, with chest pain, exertional dyspnea  ? ?Reviewed recent test results with the patient, details below. Patient continues to have symptoms of fatigue, tiredness, no energy, intermittent chest pressure that lasts for several hours.  ? ?Reviewed recent PCP labs, HDL and LDL both in 80s.  ? ? ?Current Outpatient Medications:  ?  albuterol (VENTOLIN HFA) 108 (90 Base) MCG/ACT inhaler, Inhale into the lungs., Disp: , Rfl:  ?  ascorbic acid (VITAMIN C) 500 MG tablet, Take by mouth., Disp: , Rfl:  ?  Biotin w/ Vitamins C & E (HAIR/SKIN/NAILS PO), Take by mouth., Disp: , Rfl:  ?  clobetasol (TEMOVATE) 0.05 % external solution, Apply topically., Disp: , Rfl:  ?  cyanocobalamin 1000 MCG tablet, Take by mouth., Disp: , Rfl:  ?  fluticasone (FLONASE) 50 MCG/ACT nasal spray, daily as needed., Disp: , Rfl:  ?  guaiFENesin (MUCINEX) 600 MG 12 hr tablet, Take 1 tablet (600 mg total) by mouth 2 (two) times daily., Disp: 6 tablet, Rfl: 0 ?  hydrocortisone 2.5 % ointment, Apply topically., Disp: , Rfl:  ?  ketoconazole (NIZORAL) 2 % shampoo, Apply 1 application topically 2 (two) times a week., Disp: , Rfl:  ?  Multiple Vitamin (MULTI-VITAMIN DAILY PO), Take 1 tablet by mouth daily., Disp: , Rfl:  ?  nitroGLYCERIN (NITROSTAT) 0.4 MG SL tablet, Place 1 tablet (0.4 mg total) under the tongue every 5 (five) minutes as needed for chest pain., Disp: 30 tablet, Rfl: 1 ?  triamcinolone cream (KENALOG) 0.5 %, , Disp: , Rfl:  ?  VITAMIN D, CHOLECALCIFEROL, PO, Take by mouth., Disp: , Rfl:  ? ?Cardiovascular and other pertinent studies ? ?EKG 03/22/2022: ?Sinus rhythm 72 bpm ?Low voltage in precordial leads ? ?CT cardiac scoring  03/13/2022: ?Calcium score 0 ? ?Treadmill Exercise Stress 02/23/2022: ?The patient exercised for 4 minutes and 26 seconds on Bruce protocol; achieved 6.38 METs at 72% of maximum predicted heart rate. Stress terminated due to dyspnea and fatigue. Non-diagnostic stress EKG due to low HR achieved. Chest pain is not present.  ?No arrhythmias noted on ECG at stress.  The heart rate response was normal. The blood pressure response was normal. ?  ?EKG 10/04/2021: ?Sinus rhythm 82 bpm ?Normal EKG ? ?Echocardiogram 06/03/2020: ?LVEF 55-60%. ?No significant valvular abnormality ? ? ?Recent labs: ?09/2020: ?Glucose 107, BUN/Cr 11/1.13. EGFR >60. Na/K 137/3.8. AST/ALT 59/52. Albumin 3.1. Rest of the CMP normal ?LDH 322 high ?H/H 11.7/36.3. MCV 91. Platelets 193 ? ?05/14/2020: 05/31/2020 ?Glucose 87, BUN/Cr 14/0.89. EGFR 91. Na/K 138/4.0. Alkaline Phosphatase: 45 ?H/H 11.9/37.2. MCV 92. Platelets 292 ?HbA1C 5.6% ?Chol 204, TG 40, HDL 90, LDL 106 ?TSH 0.37 normal ? ? ? ?Review of Systems  ?Cardiovascular:  Positive for dyspnea on exertion. Negative for chest pain, leg swelling, palpitations and syncope.  ?Respiratory:  Positive for shortness of breath.   ? ?   ? ? ?Vitals:  ? 03/22/22 0916  ?BP: 120/80  ?Pulse: 80  ?Resp: 16  ?Temp: 98.1 ?F (36.7 ?C)  ?SpO2: 98%  ? ? ? ?Body mass index is 39.73 kg/m?. Danley Danker Weights  ? 03/22/22 0916  ?Weight: 217 lb 3.2 oz (  98.5 kg)  ? ? ? ?Objective:  ? Physical Exam ?Vitals and nursing note reviewed.  ?Constitutional:   ?   General: She is not in acute distress. ?Neck:  ?   Vascular: No JVD.  ?Cardiovascular:  ?   Rate and Rhythm: Normal rate and regular rhythm.  ?   Heart sounds: Normal heart sounds. No murmur heard. ?Pulmonary:  ?   Effort: Pulmonary effort is normal.  ?   Breath sounds: Normal breath sounds. No wheezing or rales.  ? ? ? ? ? ?   ? ?Assessment & Recommendations:  ? ?46 y/o [place] female with obesity, mild dyslipidemia, family h/o early CAD, with chest pain, exertional  dyspnea  ? ?Chest pain: ?Atypical. Calcium score 0. Low exercise but no ischemia on exercise treadmill stress testing. ?Consider alternate caused for chest pain. ?Continue f/u  PCP ? ?F/u w/me as needed ? ? ?Nigel Mormon, MD ?Pager: [phone] ?Office: [phone] ?

## 2022-03-23 ENCOUNTER — Encounter: Payer: Self-pay | Admitting: Cardiology

## 2022-05-04 ENCOUNTER — Emergency Department (HOSPITAL_BASED_OUTPATIENT_CLINIC_OR_DEPARTMENT_OTHER)
Admission: EM | Admit: 2022-05-04 | Discharge: 2022-05-04 | Disposition: A | Discharge status: Home / Self Care | Payer: BLUE CROSS/BLUE SHIELD | Attending: Emergency Medicine | Admitting: Emergency Medicine

## 2022-05-04 ENCOUNTER — Other Ambulatory Visit: Payer: Self-pay

## 2022-05-04 ENCOUNTER — Encounter (HOSPITAL_BASED_OUTPATIENT_CLINIC_OR_DEPARTMENT_OTHER): Payer: Self-pay | Admitting: *Deleted

## 2022-05-04 ENCOUNTER — Emergency Department (HOSPITAL_BASED_OUTPATIENT_CLINIC_OR_DEPARTMENT_OTHER): Payer: BLUE CROSS/BLUE SHIELD

## 2022-05-04 DIAGNOSIS — M25512 Pain in left shoulder: Secondary | ICD-10-CM

## 2022-05-04 DIAGNOSIS — Z91040 Latex allergy status: Secondary | ICD-10-CM | POA: Insufficient documentation

## 2022-05-04 MED ORDER — ACETAMINOPHEN 500 MG PO TABS
1000.0000 mg | ORAL_TABLET | Freq: Once | ORAL | Status: AC
  Administered 2022-05-04: 1000 mg via ORAL
  Filled 2022-05-04: qty 2

## 2022-05-04 MED ORDER — IBUPROFEN 400 MG PO TABS
400.0000 mg | ORAL_TABLET | Freq: Once | ORAL | Status: AC
  Administered 2022-05-04: 400 mg via ORAL
  Filled 2022-05-04: qty 1

## 2022-05-04 NOTE — Discharge Instructions (Signed)
The x-ray looks normal today.  You might be having some pain from the ligaments.  Make sure you are taking your arm out of the sling regularly and moving it around hourly

## 2022-05-04 NOTE — ED Provider Notes (Signed)
[facility] Provider Note   CSN: [id number] Arrival date & time: 05/04/22  2031     History  Chief Complaint  Patient presents with   Shoulder Pain    Brenda Patton is a 46 y.o. female.  Patient is a 46 year old female with a history of migraines, anemia, thyroid disease who is presenting today with left shoulder pain.  Patient reports the pain started yesterday.  She was just sitting and shrugged her shoulder and then felt a pop and has had pain ever since.  The pain is worse with any type of movement.  She denies any trauma, prior surgeries or issues with this left shoulder.  She has no pain in her elbow or her wrist.  No numbness or tingling in her fingers.  Patient is right-handed  The history is provided by the patient.  Shoulder Pain      Home Medications Prior to Admission medications   Medication Sig Start Date End Date Taking? Authorizing Provider  albuterol (VENTOLIN HFA) 108 (90 Base) MCG/ACT inhaler Inhale into the lungs. 05/10/20   [provider]  hydrOXYzine (VISTARIL) 25 MG capsule Take 25 mg by mouth 2 (two) times daily as needed. 03/09/22   [provider]  Multiple Vitamin (MULTI-VITAMIN DAILY PO) Take 1 tablet by mouth daily.    [provider]      Allergies    Penicillins and Latex    Review of Systems   Review of Systems  Physical Exam Updated Vital Signs BP 111/77 (BP Location: Right Arm)   Pulse 92   Temp 98.8 F (37.1 C) (Oral)   Resp 16   Wt 102.1 kg   LMP 03/30/2022   SpO2 99%   BMI 41.15 kg/m  Physical Exam Vitals and nursing note reviewed.  Constitutional:      Appearance: Normal appearance.  HENT:     Head: Normocephalic.  Cardiovascular:     Rate and Rhythm: Normal rate.     Pulses: Normal pulses.  Musculoskeletal:        General: Tenderness present.     Left shoulder: Tenderness present. Decreased range of motion.     Left elbow: Normal.     Left wrist:  Normal.     Comments: Tenderness over the left AC joint and tenderness with palpation over the scapula.  No notable deformity  Skin:    General: Skin is warm and dry.  Neurological:     Mental Status: She is alert. Mental status is at baseline.  Psychiatric:        Mood and Affect: Mood normal.     ED Results / Procedures / Treatments   Labs (all labs ordered are listed, but only abnormal results are displayed) Labs Reviewed - No data to display  EKG None  Radiology DG Shoulder Left  Result Date: 05/04/2022 CLINICAL DATA:  Felt pop in left shoulder with decreased range of motion. EXAM: LEFT SHOULDER - 2+ VIEW COMPARISON:  None Available. FINDINGS: There is no evidence of fracture or dislocation. Mild degenerative changes are present at the acromioclavicular joint. Soft tissues are unremarkable. IMPRESSION: No acute fracture or dislocation. Electronically Signed   By: Brett Fairy M.D.   On: 05/04/2022 21:22    Procedures Procedures    Medications Ordered in ED Medications  acetaminophen (TYLENOL) tablet 1,000 mg (1,000 mg Oral Given 05/04/22 2106)  ibuprofen (ADVIL) tablet 400 mg (400 mg Oral Given 05/04/22 2107)    ED Course/ Medical Decision  Making/ A&P                           Medical Decision Making Amount and/or Complexity of Data Reviewed Radiology: ordered.  Risk OTC drugs. Prescription drug management.   Patient is a 46 year old female presenting today with new left shoulder pain that started yesterday after she felt a pop.  Patient is neurovascularly intact.  However she does have pain over the [facility] joint and scapula with palpation.  No clavicular pain.  Denies any notable trauma or prior issues with her shoulder.  No obvious deformity and low concern for dislocation.  Plain films pending patient given oral pain control.  9:47 PM I have independently visualized and interpreted pt's images today. Shoulder image is negative.  Findings discussed with the patient.   She placed in a sling and given follow-up if symptoms do not improve.  Encouraged her to range it hourly.        Final Clinical Impression(s) / ED Diagnoses Final diagnoses:  Acute pain of left shoulder    Rx / DC Orders ED Discharge Orders     None         Blanchie Dessert, MD 05/04/22 2147

## 2022-05-04 NOTE — ED Triage Notes (Signed)
Pt states that she thinks she dislocated her left shoulder.  Pt has shoulder in a homemade sling and states that she was shrugging her shoulder when she felt a pop and she has not been able to move her arm since. Cap refill under 3 sec.

## 2022-05-07 IMAGING — CT CT CARDIAC CORONARY ARTERY CALCIUM SCORE
2 of 6 series · 4 of 20 positions shown, 5 images · non-contrast
Comparison: None

CLINICAL DATA: CAD screening, intermediate CAD risk, treadmill
candidate

EXAM:
CT CARDIAC CORONARY ARTERY CALCIUM SCORE
TECHNIQUE: Non-contrast imaging through the heart was performed using
prospective ECG gating. Image post processing was performed on an
independent workstation, allowing for quantitative analysis of the
heart and coronary arteries. Note that this exam targets the heart
and the chest was not imaged in its entirety.

[Series 2: calcium scoring 2.00 qr36 bestdiast 71% hrt calciu · axial · 0.35mm/px · z∈[+1583,+1625]mm · 2 of 64 slices shown, 3 images]
[im 22/64  vessel]
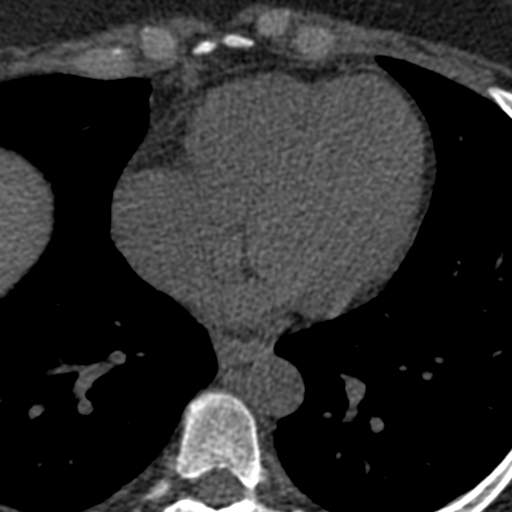
[im 22/64  lung]
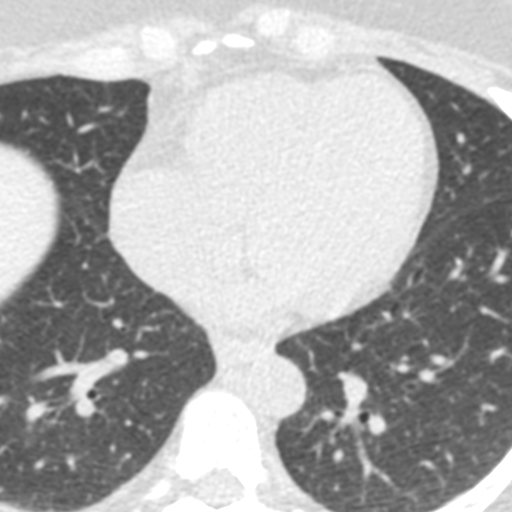
[im 43/64  vessel]
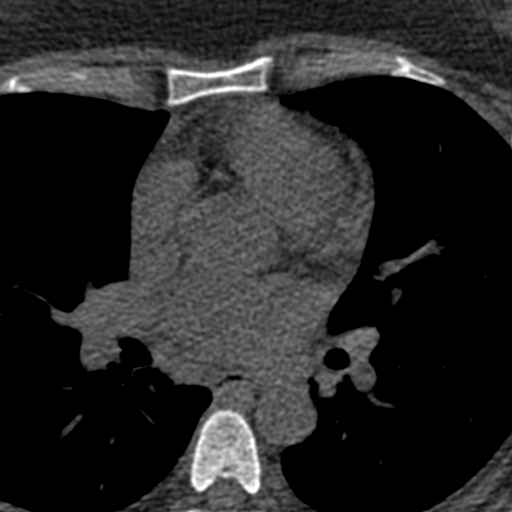

[Series 3: calcium scoring 2.00 br40 bestdiast 71% axial · axial · 0.60mm/px · z∈[+1583,+1625]mm · 2 of 63 slices shown]
[im 21/63  vessel]
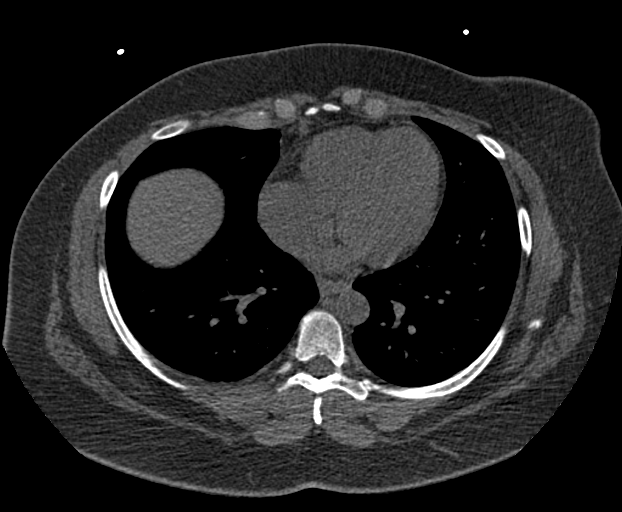
[im 42/63  vessel]
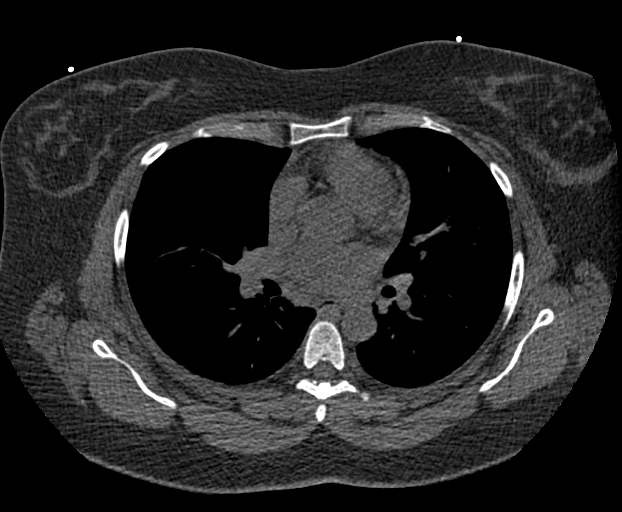

[4 of 20 positions shown; findings below may reference images not displayed]

FINDINGS: CORONARY CALCIUM SCORES:

Left Main: 0

LAD: 0

LCx: 0

RCA: 0

Total Agatston Score: 0

[HOSPITAL] percentile: 0

AORTA MEASUREMENTS:

Ascending Aorta: 28 mm

Descending Aorta: 21 mm

OTHER FINDINGS:

Heart is normal size. Aorta normal caliber. No confluent opacities
or effusions. No adenopathy. No acute findings in the upper abdomen.
Chest wall soft tissues are unremarkable. No acute bony abnormality.
IMPRESSION: No visible coronary artery calcifications. Total coronary calcium
score of 0.

No acute or significant extracardiac abnormality.

## 2022-05-10 ENCOUNTER — Ambulatory Visit: Payer: BLUE CROSS/BLUE SHIELD | Admitting: Family Medicine

## 2022-05-10 ENCOUNTER — Encounter: Payer: Self-pay | Admitting: Family Medicine

## 2022-05-10 VITALS — Ht 62.0 in | Wt 225.0 lb

## 2022-05-10 DIAGNOSIS — S39012A Strain of muscle, fascia and tendon of lower back, initial encounter: Secondary | ICD-10-CM | POA: Diagnosis not present

## 2022-05-10 MED ORDER — MELOXICAM 15 MG PO TABS: ORAL_TABLET | ORAL | 3 refills | Status: DC

## 2022-05-10 NOTE — Progress Notes (Signed)
  Brenda Patton - 46 y.o. female MRN [id number]  Date of birth: 21-Oct-1976  SUBJECTIVE:  Including CC & ROS.  No chief complaint on file.   Brenda Patton is a 46 y.o. female that is presenting with acute on chronic low back pain.  She had initial injury about a month ago.  This exacerbated her pain to where she is having localized low back pain.  No history of surgery.  She has tried physical therapy in the past as well as going to a Restaurant manager, fast food.  She has been using Flexeril.  Review of the emergency department note from 6/9 shows she is provided a sling. Independent review of the left shoulder x-ray from 6/9 shows no acute changes. Independent review of the CT scan renal study from December 2022 shows spondylosis at the L5 region.  Review of Systems See HPI   HISTORY: Past Medical, Surgical, Social, and Family History Reviewed & Updated per EMR.   Pertinent Historical Findings include:  Past Medical History:  Diagnosis Date   Anemia    Anxiety    Depression    Endometriosis    Fibroid    GERD (gastroesophageal reflux disease)    HPV in female    Migraines    Thyroid disease     Past Surgical History:  Procedure Laterality Date   BUNIONECTOMY Right    CESAREAN SECTION     DILATION AND CURETTAGE OF UTERUS     ECTOPIC PREGNANCY SURGERY     ORIF ULNAR FRACTURE Left 04/20/2016   Procedure: OPEN REDUCTION INTERNAL FIXATION (ORIF) LEFT ULNAR FRACTURE;  Surgeon: Charlotte Crumb, MD;  Location: [facility];  Service: Orthopedics;  Laterality: Left;     PHYSICAL EXAM:  VS: Ht '5\' 2"'$  (1.575 m)   Wt 225 lb (102.1 kg)   LMP 03/30/2022   BMI 41.15 kg/m  Physical Exam Gen: NAD, alert, cooperative with exam, well-appearing MSK:  Neurovascularly intact       ASSESSMENT & PLAN:   Strain of lumbar region Acute on chronic in nature.  She has been dealing with pain for a little over a month now.  She has a history of similar pain in the lower  back.  No radicular symptoms today. -Counseled on home exercise therapy and supportive care. -Meloxicam. -Referral to physical therapy. -Provided work note. -Could consider further imaging.

## 2022-05-10 NOTE — Assessment & Plan Note (Signed)
Acute on chronic in nature.  She has been dealing with pain for a little over a month now.  She has a history of similar pain in the lower back.  No radicular symptoms today. -Counseled on home exercise therapy and supportive care. -Meloxicam. -Referral to physical therapy. -Provided work note. -Could consider further imaging.

## 2022-05-10 NOTE — Patient Instructions (Signed)
Nice to meet you Please try heat. Please consider the compression. I have made a referral to physical therapy. Please try the exercises Please send me a message in MyChart with any questions or updates.  Please see me back in 4 weeks.   --Dr. Raeford Razor

## 2022-06-05 ENCOUNTER — Ambulatory Visit: Payer: BLUE CROSS/BLUE SHIELD | Attending: Family Medicine | Admitting: Physical Therapy

## 2022-06-07 ENCOUNTER — Encounter: Payer: Self-pay | Admitting: Family Medicine

## 2022-06-07 ENCOUNTER — Ambulatory Visit: Payer: BLUE CROSS/BLUE SHIELD | Admitting: Family Medicine

## 2022-06-07 VITALS — BP 110/80 | Ht 62.0 in | Wt 216.0 lb

## 2022-06-07 DIAGNOSIS — S39012D Strain of muscle, fascia and tendon of lower back, subsequent encounter: Secondary | ICD-10-CM

## 2022-06-07 NOTE — Assessment & Plan Note (Signed)
Improving with therapy thus far.  Still notices pain intermittently. -Counseled on home exercise therapy and supportive care. -Continue physical therapy. -Provided work note.

## 2022-06-07 NOTE — Progress Notes (Signed)
  Brenda Patton - 46 y.o. female MRN [id number]  Date of birth: [birth year]  SUBJECTIVE:  Including CC & ROS.  No chief complaint on file.   Brenda Patton is a 46 y.o. female that is following up for low back pain.  She has noticed improvement with modalities today.  She has just started physical therapy.   Review of Systems See HPI   HISTORY: Past Medical, Surgical, Social, and Family History Reviewed & Updated per EMR.   Pertinent Historical Findings include:  Past Medical History:  Diagnosis Date   Anemia    Anxiety    Depression    Endometriosis    Fibroid    GERD (gastroesophageal reflux disease)    HPV in female    Migraines    Thyroid disease     Past Surgical History:  Procedure Laterality Date   BUNIONECTOMY Right    CESAREAN SECTION     DILATION AND CURETTAGE OF UTERUS     ECTOPIC PREGNANCY SURGERY     ORIF ULNAR FRACTURE Left 04/20/2016   Procedure: OPEN REDUCTION INTERNAL FIXATION (ORIF) LEFT ULNAR FRACTURE;  Surgeon: Charlotte Crumb, MD;  Location: [facility];  Service: Orthopedics;  Laterality: Left;     PHYSICAL EXAM:  VS: BP 110/80 (BP Location: Left Arm, Patient Position: Sitting)   Ht '5\' 2"'$  (1.575 m)   Wt 216 lb (98 kg)   BMI 39.51 kg/m  Physical Exam Gen: NAD, alert, cooperative with exam, well-appearing MSK:  Neurovascularly intact       ASSESSMENT & PLAN:   Strain of lumbar region Improving with therapy thus far.  Still notices pain intermittently. -Counseled on home exercise therapy and supportive care. -Continue physical therapy. -Provided work note.

## 2022-06-07 NOTE — Patient Instructions (Signed)
Good to see you Please use heat  Please continue physical therapy   Please send me a message in MyChart with any questions or updates.  Please see me back as needed if better.   --Dr. Raeford Razor

## 2022-06-13 ENCOUNTER — Ambulatory Visit: Payer: BLUE CROSS/BLUE SHIELD

## 2022-06-20 ENCOUNTER — Ambulatory Visit: Payer: BLUE CROSS/BLUE SHIELD | Admitting: Physical Therapy

## 2022-07-04 ENCOUNTER — Encounter: Payer: BLUE CROSS/BLUE SHIELD | Admitting: Physical Therapy

## 2022-07-09 ENCOUNTER — Encounter: Payer: Self-pay | Admitting: Family Medicine

## 2022-07-09 ENCOUNTER — Ambulatory Visit: Payer: BLUE CROSS/BLUE SHIELD | Admitting: Family Medicine

## 2022-07-09 ENCOUNTER — Ambulatory Visit (HOSPITAL_BASED_OUTPATIENT_CLINIC_OR_DEPARTMENT_OTHER)
Admission: RE | Admit: 2022-07-09 | Discharge: 2022-07-09 | Disposition: A | Discharge status: Home / Self Care | Payer: BLUE CROSS/BLUE SHIELD | Source: Ambulatory Visit | Attending: Family Medicine | Admitting: Family Medicine

## 2022-07-09 VITALS — BP 129/83 | Ht 62.0 in | Wt 213.0 lb

## 2022-07-09 DIAGNOSIS — S39012D Strain of muscle, fascia and tendon of lower back, subsequent encounter: Secondary | ICD-10-CM

## 2022-07-09 NOTE — Assessment & Plan Note (Signed)
Did well with physical therapy.  Has good motion and strength. -Counseled on home exercise therapy and supportive care. -X-ray - Follow-up as needed.

## 2022-07-09 NOTE — Patient Instructions (Signed)
Good to see you Please use heat as needed  Please continue the exercises  I will call with the xray results.   Please send me a message in MyChart with any questions or updates.  Please see me back as needed.   --Dr. Raeford Razor

## 2022-07-09 NOTE — Progress Notes (Signed)
  Brenda Patton - 46 y.o. female MRN [id number]  Date of birth: 12/19/1975  SUBJECTIVE:  Including CC & ROS.  No chief complaint on file.   Brenda Patton is a 46 y.o. female that is following up for low back pain.  She has done well since completing physical therapy.  No longer having the pain that she was previously having..   Review of Systems See HPI   HISTORY: Past Medical, Surgical, Social, and Family History Reviewed & Updated per EMR.   Pertinent Historical Findings include:  Past Medical History:  Diagnosis Date   Anemia    Anxiety    Depression    Endometriosis    Fibroid    GERD (gastroesophageal reflux disease)    HPV in female    Migraines    Thyroid disease     Past Surgical History:  Procedure Laterality Date   BUNIONECTOMY Right    CESAREAN SECTION     DILATION AND CURETTAGE OF UTERUS     ECTOPIC PREGNANCY SURGERY     ORIF ULNAR FRACTURE Left 04/20/2016   Procedure: OPEN REDUCTION INTERNAL FIXATION (ORIF) LEFT ULNAR FRACTURE;  Surgeon: Charlotte Crumb, MD;  Location: [facility];  Service: Orthopedics;  Laterality: Left;     PHYSICAL EXAM:  VS: BP 129/83 (BP Location: Left Arm, Patient Position: Sitting)   Ht '5\' 2"'$  (1.575 m)   Wt 213 lb (96.6 kg)   BMI 38.96 kg/m  Physical Exam Gen: NAD, alert, cooperative with exam, well-appearing MSK:  Neurovascularly intact       ASSESSMENT & PLAN:   Strain of lumbar region Did well with physical therapy.  Has good motion and strength. -Counseled on home exercise therapy and supportive care. -X-ray - Follow-up as needed.

## 2022-07-11 ENCOUNTER — Telehealth: Payer: Self-pay | Admitting: Family Medicine

## 2022-07-11 NOTE — Telephone Encounter (Signed)
Unable to leave Vm for patient. If she calls back please have her speak with a nurse/CMA and inform she is having excessive lordosis and mild degenerative changes in the facet joints..   If any questions then please take the best time and phone number to call and I will try to call her back.   Rosemarie Ax, MD [facility] 07/11/2022, 8:27 AM

## 2023-01-13 ENCOUNTER — Emergency Department (HOSPITAL_BASED_OUTPATIENT_CLINIC_OR_DEPARTMENT_OTHER): Payer: BLUE CROSS/BLUE SHIELD

## 2023-01-13 ENCOUNTER — Encounter (HOSPITAL_BASED_OUTPATIENT_CLINIC_OR_DEPARTMENT_OTHER): Payer: Self-pay | Admitting: Emergency Medicine

## 2023-01-13 ENCOUNTER — Other Ambulatory Visit: Payer: Self-pay

## 2023-01-13 ENCOUNTER — Emergency Department (HOSPITAL_BASED_OUTPATIENT_CLINIC_OR_DEPARTMENT_OTHER)
Admission: EM | Admit: 2023-01-13 | Discharge: 2023-01-13 | Disposition: A | Discharge status: Home / Self Care | Payer: BLUE CROSS/BLUE SHIELD | Attending: Emergency Medicine | Admitting: Emergency Medicine

## 2023-01-13 DIAGNOSIS — Z91040 Latex allergy status: Secondary | ICD-10-CM | POA: Insufficient documentation

## 2023-01-13 DIAGNOSIS — R1084 Generalized abdominal pain: Secondary | ICD-10-CM | POA: Insufficient documentation

## 2023-01-13 DIAGNOSIS — R102 Pelvic and perineal pain: Secondary | ICD-10-CM | POA: Diagnosis present

## 2023-01-13 LAB — COMPREHENSIVE METABOLIC PANEL
ALT: 10 U/L (ref 0–44)
AST: 17 U/L (ref 15–41)
Albumin: 3.3 g/dL — ABNORMAL LOW (ref 3.5–5.0)
Alkaline Phosphatase: 39 U/L (ref 38–126)
Anion gap: 5 (ref 5–15)
BUN: 16 mg/dL (ref 6–20)
CO2: 25 mmol/L (ref 22–32)
Calcium: 8.7 mg/dL — ABNORMAL LOW (ref 8.9–10.3)
Chloride: 104 mmol/L (ref 98–111)
Creatinine, Ser: 1.06 mg/dL — ABNORMAL HIGH (ref 0.44–1.00)
GFR, Estimated: >60 mL/min (ref >60)
Glucose, Bld: 92 mg/dL (ref 70–99)
Potassium: 3.7 mmol/L (ref 3.5–5.1)
Sodium: 134 mmol/L — ABNORMAL LOW (ref 135–145)
Total Bilirubin: 0.5 mg/dL (ref 0.3–1.2)
Total Protein: 7.9 g/dL (ref 6.5–8.1)

## 2023-01-13 LAB — CBC WITH DIFFERENTIAL/PLATELET
Abs Immature Granulocytes: 0.02 10*3/uL (ref 0.00–0.07)
Basophils Absolute: 0 10*3/uL (ref 0.0–0.1)
Basophils Relative: 1 %
Eosinophils Absolute: 0.1 10*3/uL (ref 0.0–0.5)
Eosinophils Relative: 2 %
HCT: 36 % (ref 36.0–46.0)
Hemoglobin: 11.6 g/dL — ABNORMAL LOW (ref 12.0–15.0)
Immature Granulocytes: 0 %
Lymphocytes Relative: 31 %
Lymphs Abs: 1.5 10*3/uL (ref 0.7–4.0)
MCH: 29.5 pg (ref 26.0–34.0)
MCHC: 32.2 g/dL (ref 30.0–36.0)
MCV: 91.6 fL (ref 80.0–100.0)
Monocytes Absolute: 0.4 10*3/uL (ref 0.1–1.0)
Monocytes Relative: 8 %
Neutro Abs: 2.7 10*3/uL (ref 1.7–7.7)
Neutrophils Relative %: 58 %
Platelets: 244 10*3/uL (ref 150–400)
RBC: 3.93 MIL/uL (ref 3.87–5.11)
RDW: 13.2 % (ref 11.5–15.5)
WBC: 4.8 10*3/uL (ref 4.0–10.5)
nRBC: 0 % (ref 0.0–0.2)

## 2023-01-13 LAB — URINALYSIS, ROUTINE W REFLEX MICROSCOPIC
Bilirubin Urine: NEGATIVE
Glucose, UA: NEGATIVE mg/dL
Hgb urine dipstick: NEGATIVE
Ketones, ur: NEGATIVE mg/dL
Leukocytes,Ua: NEGATIVE
Nitrite: NEGATIVE
Protein, ur: NEGATIVE mg/dL
Specific Gravity, Urine: 1.02 (ref 1.005–1.030)
pH: 6.5 (ref 5.0–8.0)

## 2023-01-13 LAB — PREGNANCY, URINE: Preg Test, Ur: NEGATIVE

## 2023-01-13 MED ORDER — IOHEXOL 300 MG/ML  SOLN
100.0000 mL | Freq: Once | INTRAMUSCULAR | Status: AC | PRN
  Administered 2023-01-13: 100 mL via INTRAVENOUS

## 2023-01-13 NOTE — ED Triage Notes (Signed)
Pt c/o pelvic pain and pressure x couple week; reports some urgency; denies vaginal discharge

## 2023-01-13 NOTE — Discharge Instructions (Addendum)
Try take a dose of miralax a day for one week t see if this helps discomfort.  Increase water intake.  YOur creatine is slightly elevated.  Have your primary care physician follow this and recheck

## 2023-01-13 NOTE — ED Provider Notes (Signed)
[facility] [place] Provider Note   CSN: [id number] Arrival date & time: 01/13/23  1420     History  Chief Complaint  Patient presents with   Pelvic Pain    Brenda Patton is a 47 y.o. female.  Patient complains of lower abdominal discomfort for the past 2 weeks.  Patient was seen by gynecology 4 days ago and had a Pap smear and Aptima swab.  She reports she has not had any vaginal discharge she denies any risk of any sexually transmitted disease patient denies any risk of pregnancy.  Patient reports she has had some discomfort with urination.  Patient reports she has had a past medical history of having had a colon polyp removed.  Patient is being evaluated by GYN for possible fibroids.  Patient denies any fever or chills she has not had any nausea or vomiting.  The history is provided by the patient.  Pelvic Pain This is a new problem. The problem occurs constantly. Associated symptoms include abdominal pain. Pertinent negatives include no chest pain.       Home Medications Prior to Admission medications   Medication Sig Start Date End Date Taking? Authorizing Provider  albuterol (VENTOLIN HFA) 108 (90 Base) MCG/ACT inhaler Inhale into the lungs. 05/10/20   [provider]  hydrOXYzine (VISTARIL) 25 MG capsule Take 25 mg by mouth 2 (two) times daily as needed. 03/09/22   [provider]  meloxicam (MOBIC) 15 MG tablet One tab PO qAM with breakfast for 2 weeks, then daily prn pain. 05/10/22   Rosemarie Ax, MD  Multiple Vitamin (MULTI-VITAMIN DAILY PO) Take 1 tablet by mouth daily.    [provider]      Allergies    Penicillins and Latex    Review of Systems   Review of Systems  Cardiovascular:  Negative for chest pain.  Gastrointestinal:  Positive for abdominal pain.  Genitourinary:  Positive for pelvic pain.  All other systems reviewed and are negative.   Physical Exam Updated Vital Signs BP  110/67 (BP Location: Left Arm)   Pulse 78   Temp 98.2 F (36.8 C) (Oral)   Resp 16   Ht 5' 2"$  (1.575 m)   Wt 98.9 kg   LMP 12/31/2022   SpO2 96%   BMI 39.87 kg/m  Physical Exam Vitals and nursing note reviewed.  Constitutional:      Appearance: She is well-developed.  HENT:     Head: Normocephalic.     Mouth/Throat:     Mouth: Mucous membranes are moist.  Cardiovascular:     Rate and Rhythm: Normal rate.  Pulmonary:     Effort: Pulmonary effort is normal.  Abdominal:     General: Abdomen is flat. There is no distension.  Musculoskeletal:        General: Normal range of motion.     Cervical back: Normal range of motion.  Skin:    General: Skin is warm.  Neurological:     General: No focal deficit present.     Mental Status: She is alert and oriented to person, place, and time.     ED Results / Procedures / Treatments   Labs (all labs ordered are listed, but only abnormal results are displayed) Labs Reviewed  CBC WITH DIFFERENTIAL/PLATELET - Abnormal; Notable for the following components:      Result Value   Hemoglobin 11.6 (*)    All other components within normal limits  COMPREHENSIVE METABOLIC PANEL -  Abnormal; Notable for the following components:   Sodium 134 (*)    Creatinine, Ser 1.06 (*)    Calcium 8.7 (*)    Albumin 3.3 (*)    All other components within normal limits  URINALYSIS, ROUTINE W REFLEX MICROSCOPIC  PREGNANCY, URINE    EKG None  Radiology CT ABDOMEN PELVIS W CONTRAST  Result Date: 01/13/2023 CLINICAL DATA:  RIGHT lower quadrant abdominal pain. Several weeks of pressure. EXAM: CT ABDOMEN AND PELVIS WITH CONTRAST TECHNIQUE: Multidetector CT imaging of the abdomen and pelvis was performed using the standard protocol following bolus administration of intravenous contrast. RADIATION DOSE REDUCTION: This exam was performed according to the departmental dose-optimization program which includes automated exposure control, adjustment of the mA  and/or kV according to patient size and/or use of iterative reconstruction technique. CONTRAST:  141m OMNIPAQUE IOHEXOL 300 MG/ML  SOLN COMPARISON:  None Available. FINDINGS: Lower chest: Lung bases are clear. Hepatobiliary: No focal hepatic lesion. Normal gallbladder. No biliary duct dilatation. Common bile duct is normal. Pancreas: Pancreas is normal. No ductal dilatation. No pancreatic inflammation. Spleen: Normal spleen Adrenals/urinary tract: Adrenal glands and kidneys are normal. The ureters and bladder normal. Stomach/Bowel: Stomach, small bowel, appendix, and cecum are normal. The colon and rectosigmoid colon are normal. Vascular/Lymphatic: Abdominal aorta is normal caliber. No periportal or retroperitoneal adenopathy. No pelvic adenopathy. Reproductive: Uterus and adnexa unremarkable. Other: No free fluid. Musculoskeletal: No aggressive osseous lesion. IMPRESSION: 1. No acute findings in the abdomen pelvis. 2. Normal appendix. 3. No obstructive uropathy. Electronically Signed   By: SSuzy BouchardM.D.   On: 01/13/2023 18:22    Procedures Procedures    Medications Ordered in ED Medications  iohexol (OMNIPAQUE) 300 MG/ML solution 100 mL (100 mLs Intravenous Contrast Given 01/13/23 1753)    ED Course/ Medical Decision Making/ A&P                             Medical Decision Making Patient complains of suprapubic and lower abdominal discomfort.  Patient saw her gynecologist 4 days ago and had Pap smear and swabs.  Amount and/or Complexity of Data Reviewed External Data Reviewed: notes.    Details: Gynecology is reviewed Labs: ordered. Decision-making details documented in ED Course.    Details: Labs ordered reviewed and interpreted patient has normal blood cell count.  Significant chemistry changes Radiology: ordered and independent interpretation performed. Decision-making details documented in ED Course.    Details: CT abdomen and pelvis shows no acute abnormality  Risk Risk  Details: Patient counseled on CT and lab findings she is advised to follow-up with her gynecologist as scheduled.           Final Clinical Impression(s) / ED Diagnoses Final diagnoses:  Generalized abdominal pain    Rx / DC Orders ED Discharge Orders     None      An After Visit Summary was printed and given to the patient.    SFransico Meadow PA-C 01/14/23 0001    YDrenda Freeze MD 01/14/23 [phone]

## 2023-03-11 ENCOUNTER — Encounter: Payer: Self-pay | Admitting: *Deleted

## 2023-06-22 ENCOUNTER — Encounter (HOSPITAL_COMMUNITY): Payer: Self-pay | Admitting: Emergency Medicine

## 2023-06-22 ENCOUNTER — Emergency Department (HOSPITAL_COMMUNITY): Payer: BC Managed Care – PPO

## 2023-06-22 ENCOUNTER — Other Ambulatory Visit: Payer: Self-pay

## 2023-06-22 ENCOUNTER — Emergency Department (HOSPITAL_COMMUNITY)
Admission: EM | Admit: 2023-06-22 | Discharge: 2023-06-23 | Disposition: A | Discharge status: Home / Self Care | Payer: BC Managed Care – PPO | Attending: Emergency Medicine | Admitting: Emergency Medicine

## 2023-06-22 DIAGNOSIS — M79604 Pain in right leg: Secondary | ICD-10-CM

## 2023-06-22 DIAGNOSIS — M79661 Pain in right lower leg: Secondary | ICD-10-CM | POA: Insufficient documentation

## 2023-06-22 DIAGNOSIS — Z91040 Latex allergy status: Secondary | ICD-10-CM | POA: Insufficient documentation

## 2023-06-22 DIAGNOSIS — R0789 Other chest pain: Secondary | ICD-10-CM | POA: Diagnosis not present

## 2023-06-22 NOTE — ED Triage Notes (Signed)
Pt with sudden onset of "excrutiating to the touch" leg pain on the right calf.  States this began last night about 1800.  Does not hurt as much to walk but to touch is very painful.  Pt denies oral contraceptives, smoking, or long trips but does work a sedentary job.

## 2023-06-22 NOTE — ED Provider Notes (Signed)
[place] [facility] AT [facility] Provider Note   CSN: [id number] Arrival date & time: 06/22/23  2143     History  Chief Complaint  Patient presents with   Leg Pain    Brenda Patton is a 47 y.o. female.  Patient presents to the emergency department for evaluation of leg pain.  Patient reports that she had onset of right anterolateral lower leg pain yesterday around 6 PM.  Pain came on somewhat suddenly.  She reports that the area is very tender to the touch.  She can walk on it.  Today she has had persistent pain and also has noticed some tightness in the center of her chest.  She feels off balance when she walks.       Home Medications Prior to Admission medications   Medication Sig Start Date End Date Taking? Authorizing Provider  ibuprofen (ADVIL) 800 MG tablet Take 1 tablet (800 mg total) by mouth every 6 (six) hours as needed for moderate pain. 06/23/23  Yes Deneene Tarver, Canary Brim, MD  methocarbamol (ROBAXIN) 500 MG tablet Take 1 tablet (500 mg total) by mouth every 8 (eight) hours as needed for muscle spasms. 06/23/23  Yes Arash Karstens, Canary Brim, MD  albuterol (VENTOLIN HFA) 108 (90 Base) MCG/ACT inhaler Inhale into the lungs. 05/10/20   [provider]  Multiple Vitamin (MULTI-VITAMIN DAILY PO) Take 1 tablet by mouth daily.    [provider]      Allergies    Penicillins and Latex    Review of Systems   Review of Systems  Physical Exam Updated Vital Signs BP 115/75 (BP Location: Right Arm)   Pulse 80   Temp 98.1 F (36.7 C)   Resp 18   SpO2 100%  Physical Exam Vitals and nursing note reviewed.  Constitutional:      General: She is not in acute distress.    Appearance: She is well-developed.  HENT:     Head: Normocephalic and atraumatic.     Mouth/Throat:     Mouth: Mucous membranes are moist.  Eyes:     General: Vision grossly intact. Gaze aligned appropriately.     Extraocular Movements: Extraocular  movements intact.     Conjunctiva/sclera: Conjunctivae normal.  Cardiovascular:     Rate and Rhythm: Normal rate and regular rhythm.     Pulses: Normal pulses.     Heart sounds: Normal heart sounds, S1 normal and S2 normal. No murmur heard.    No friction rub. No gallop.  Pulmonary:     Effort: Pulmonary effort is normal. No respiratory distress.     Breath sounds: Normal breath sounds.  Abdominal:     General: Bowel sounds are normal.     Palpations: Abdomen is soft.     Tenderness: There is no abdominal tenderness. There is no guarding or rebound.     Hernia: No hernia is present.  Musculoskeletal:        General: No swelling.     Cervical back: Full passive range of motion without pain, normal range of motion and neck supple. No spinous process tenderness or muscular tenderness. Normal range of motion.     Right lower leg: Tenderness present. No swelling. No edema.     Left lower leg: No edema.       Legs:  Skin:    General: Skin is warm and dry.     Capillary Refill: Capillary refill takes less than 2 seconds.     Findings: No  ecchymosis, erythema, rash or wound.  Neurological:     General: No focal deficit present.     Mental Status: She is alert and oriented to person, place, and time.     GCS: GCS eye subscore is 4. GCS verbal subscore is 5. GCS motor subscore is 6.     Cranial Nerves: Cranial nerves 2-12 are intact.     Sensory: Sensation is intact.     Motor: Motor function is intact.     Coordination: Coordination is intact.  Psychiatric:        Attention and Perception: Attention normal.        Mood and Affect: Mood normal.        Speech: Speech normal.        Behavior: Behavior normal.     ED Results / Procedures / Treatments   Labs (all labs ordered are listed, but only abnormal results are displayed) Labs Reviewed  CBC WITH DIFFERENTIAL/PLATELET - Abnormal; Notable for the following components:      Result Value   RBC 3.81 (*)    Hemoglobin 11.1 (*)     HCT 35.2 (*)    All other components within normal limits  BASIC METABOLIC PANEL - Abnormal; Notable for the following components:   Creatinine, Ser 1.04 (*)    Calcium 8.8 (*)    All other components within normal limits  D-DIMER, QUANTITATIVE  CK  HCG, SERUM, QUALITATIVE  TROPONIN I (HIGH SENSITIVITY)  TROPONIN I (HIGH SENSITIVITY)    EKG None  Radiology DG Tibia/Fibula Right  Result Date: 06/23/2023 CLINICAL DATA:  Right calf pain. EXAM: RIGHT TIBIA AND FIBULA - 2 VIEW COMPARISON:  None Available. FINDINGS: There is no evidence of fracture or other focal bone lesions. Soft tissues are unremarkable. IMPRESSION: Negative. Electronically Signed   By: Aram Candela M.D.   On: 06/23/2023 00:09   DG Chest 2 View  Result Date: 06/23/2023 CLINICAL DATA:  Chest pain. EXAM: CHEST - 2 VIEW COMPARISON:  March 02, 2022 FINDINGS: The heart size and mediastinal contours are within normal limits. Both lungs are clear. The visualized skeletal structures are unremarkable. IMPRESSION: No active cardiopulmonary disease. Electronically Signed   By: Aram Candela M.D.   On: 06/23/2023 00:08    Procedures Procedures    Medications Ordered in ED Medications - No data to display  ED Course/ Medical Decision Making/ A&P                             Medical Decision Making Amount and/or Complexity of Data Reviewed External Data Reviewed: ECG. Labs: ordered. Decision-making details documented in ED Course. Radiology: ordered and independent interpretation performed. Decision-making details documented in ED Course. ECG/medicine tests: ordered and independent interpretation performed. Decision-making details documented in ED Course.   Differential Diagnosis considered includes, but not limited to: STEMI; NSTEMI; myocarditis; pericarditis; pulmonary embolism; aortic dissection; pneumothorax; pneumonia; gastritis; musculoskeletal pain; DVT  Presents to the emergency department with primary  complaint of right lower leg pain.  Pain started somewhat suddenly.  Pain is in the anterior portion of the right lower leg.  There is no posterior tenderness or calf swelling.  No venous cords.  Felt to be unlikely to be DVT.  D-dimer negative.  Patient with good blood flow, no concern for ischemic limb.  No overlying skin changes to suggest infection.  X-ray without orthopedic changes.  Patient also spirits and chest pain.  This is somewhat atypical  in nature as well.  EKG without ischemic changes.  Troponin negative.  D-dimer negative, doubt PE.  Presentation and examination are most consistent with soft tissue source of pain.  She has tenderness to palpation but there are no overlying skin changes to suggest cellulitis, no signs of abscess.  CPK negative, no signs of rhabdo.  Electrolytes unremarkable.  Treat with analgesia, muscle relaxer, rest.        Final Clinical Impression(s) / ED Diagnoses Final diagnoses:  Right leg pain    Rx / DC Orders ED Discharge Orders          Ordered    ibuprofen (ADVIL) 800 MG tablet  Every 6 hours PRN        06/23/23 0321    methocarbamol (ROBAXIN) 500 MG tablet  Every 8 hours PRN        06/23/23 0321              Gilda Crease, MD 06/23/23 [phone]

## 2023-06-23 LAB — CBC WITH DIFFERENTIAL/PLATELET
Abs Immature Granulocytes: 0.03 10*3/uL (ref 0.00–0.07)
Basophils Absolute: 0 10*3/uL (ref 0.0–0.1)
Basophils Relative: 1 %
Eosinophils Absolute: 0.2 10*3/uL (ref 0.0–0.5)
Eosinophils Relative: 3 %
HCT: 35.2 % — ABNORMAL LOW (ref 36.0–46.0)
Hemoglobin: 11.1 g/dL — ABNORMAL LOW (ref 12.0–15.0)
Immature Granulocytes: 1 %
Lymphocytes Relative: 36 %
Lymphs Abs: 2 10*3/uL (ref 0.7–4.0)
MCH: 29.1 pg (ref 26.0–34.0)
MCHC: 31.5 g/dL (ref 30.0–36.0)
MCV: 92.4 fL (ref 80.0–100.0)
Monocytes Absolute: 0.6 10*3/uL (ref 0.1–1.0)
Monocytes Relative: 10 %
Neutro Abs: 2.8 10*3/uL (ref 1.7–7.7)
Neutrophils Relative %: 49 %
Platelets: 266 10*3/uL (ref 150–400)
RBC: 3.81 MIL/uL — ABNORMAL LOW (ref 3.87–5.11)
RDW: 13.4 % (ref 11.5–15.5)
WBC: 5.5 10*3/uL (ref 4.0–10.5)
nRBC: 0 % (ref 0.0–0.2)

## 2023-06-23 LAB — TROPONIN I (HIGH SENSITIVITY)
Troponin I (High Sensitivity): 3 ng/L (ref <18)
Troponin I (High Sensitivity): 3 ng/L (ref <18)

## 2023-06-23 LAB — HCG, SERUM, QUALITATIVE: Preg, Serum: NEGATIVE

## 2023-06-23 LAB — BASIC METABOLIC PANEL WITH GFR
Anion gap: 7 (ref 5–15)
BUN: 13 mg/dL (ref 6–20)
CO2: 24 mmol/L (ref 22–32)
Calcium: 8.8 mg/dL — ABNORMAL LOW (ref 8.9–10.3)
Chloride: 107 mmol/L (ref 98–111)
Creatinine, Ser: 1.04 mg/dL — ABNORMAL HIGH (ref 0.44–1.00)
GFR, Estimated: >60 mL/min (ref >60)
Glucose, Bld: 93 mg/dL (ref 70–99)
Potassium: 3.7 mmol/L (ref 3.5–5.1)
Sodium: 138 mmol/L (ref 135–145)

## 2023-06-23 LAB — D-DIMER, QUANTITATIVE: D-Dimer, Quant: 0.42 ug{FEU}/mL (ref 0.00–0.50)

## 2023-06-23 LAB — CK: Total CK: 170 U/L (ref 38–234)

## 2023-06-23 MED ORDER — ALBUTEROL SULFATE HFA 108 (90 BASE) MCG/ACT IN AERS: 2.0000 | INHALATION_SPRAY | RESPIRATORY_TRACT | 2 refills | Status: AC | PRN

## 2023-06-23 MED ORDER — METHOCARBAMOL 500 MG PO TABS: 500.0000 mg | ORAL_TABLET | Freq: Three times a day (TID) | ORAL | 0 refills | Status: AC | PRN

## 2023-06-23 MED ORDER — IBUPROFEN 800 MG PO TABS: 800.0000 mg | ORAL_TABLET | Freq: Four times a day (QID) | ORAL | 0 refills | Status: AC | PRN

## 2023-06-23 NOTE — ED Notes (Signed)
Pt verbalized understanding of discharge instructions. Pt ambulated from the ed with steady gait.

## 2023-06-24 ENCOUNTER — Emergency Department (HOSPITAL_COMMUNITY)
Admission: EM | Admit: 2023-06-24 | Discharge: 2023-06-25 | Disposition: A | Discharge status: Home / Self Care | Payer: BC Managed Care – PPO | Attending: Emergency Medicine | Admitting: Emergency Medicine

## 2023-06-24 ENCOUNTER — Encounter (HOSPITAL_COMMUNITY): Payer: Self-pay

## 2023-06-24 ENCOUNTER — Other Ambulatory Visit: Payer: Self-pay

## 2023-06-24 DIAGNOSIS — M79604 Pain in right leg: Secondary | ICD-10-CM

## 2023-06-24 DIAGNOSIS — M79661 Pain in right lower leg: Secondary | ICD-10-CM | POA: Diagnosis not present

## 2023-06-24 DIAGNOSIS — Z91040 Latex allergy status: Secondary | ICD-10-CM | POA: Insufficient documentation

## 2023-06-24 NOTE — ED Triage Notes (Signed)
Pt c/o right lower leg pain since Thursday while at work. Pt reports she went to ED at [facility] on Saturday but they didn't have [place] there. Pt reports she has had a blood clot in her leg before.

## 2023-06-25 ENCOUNTER — Ambulatory Visit (HOSPITAL_BASED_OUTPATIENT_CLINIC_OR_DEPARTMENT_OTHER)
Admission: RE | Admit: 2023-06-25 | Discharge: 2023-06-25 | Disposition: A | Discharge status: Home / Self Care | Payer: BC Managed Care – PPO | Source: Ambulatory Visit | Attending: Emergency Medicine | Admitting: Emergency Medicine

## 2023-06-25 ENCOUNTER — Ambulatory Visit (HOSPITAL_COMMUNITY): Payer: BC Managed Care – PPO | Attending: Emergency Medicine

## 2023-06-25 ENCOUNTER — Encounter (HOSPITAL_COMMUNITY): Payer: Self-pay

## 2023-06-25 DIAGNOSIS — M79662 Pain in left lower leg: Secondary | ICD-10-CM

## 2023-06-25 DIAGNOSIS — M79661 Pain in right lower leg: Secondary | ICD-10-CM | POA: Insufficient documentation

## 2023-06-25 NOTE — ED Provider Notes (Signed)
[place] EMERGENCY DEPARTMENT AT [facility] Provider Note   CSN: [id number] Arrival date & time: 06/24/23  2230     History  Chief Complaint  Patient presents with   Leg Pain    Brenda Patton is a 47 y.o. female.  The history is provided by the patient and medical records.  Leg Pain  47 year old female with history of anxiety, GERD, depression, migraine headaches, anemia, thyroid disease, presenting to the ED with right lower leg pain.  States this has been ongoing since Thursday.  She was seen at the hospital over the weekend and had a reassuring workup including negative D-dimer.  She remains concerned for possible DVT and is requesting vascular ultrasound.  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  She denies any chest pain or shortness of breath.  She did try to contact her PCP, however he is out of the office all week and has no available appointments next week either.  Home Medications Prior to Admission medications   Medication Sig Start Date End Date Taking? Authorizing Provider  albuterol (VENTOLIN HFA) 108 (90 Base) MCG/ACT inhaler Inhale into the lungs. 05/10/20   [provider]  albuterol (VENTOLIN HFA) 108 (90 Base) MCG/ACT inhaler Inhale 2 puffs into the lungs every 4 (four) hours as needed for wheezing or shortness of breath. 06/23/23   Pollina, Canary Brim, MD  ibuprofen (ADVIL) 800 MG tablet Take 1 tablet (800 mg total) by mouth every 6 (six) hours as needed for moderate pain. 06/23/23   Gilda Crease, MD  methocarbamol (ROBAXIN) 500 MG tablet Take 1 tablet (500 mg total) by mouth every 8 (eight) hours as needed for muscle spasms. 06/23/23   Gilda Crease, MD  Multiple Vitamin (MULTI-VITAMIN DAILY PO) Take 1 tablet by mouth daily.    [provider]      Allergies    Penicillins and Latex    Review of Systems   Review of Systems  Musculoskeletal:   Positive for arthralgias.  All other systems reviewed and are negative.   Physical Exam Updated Vital Signs BP 125/70   Pulse 84   Temp 98.6 F (37 C) (Oral)   Resp 17   Ht 5\' 2"  (1.575 m)   Wt 102.1 kg   SpO2 100%   BMI 41.15 kg/m   Physical Exam Vitals and nursing note reviewed.  Constitutional:      Appearance: She is well-developed.  HENT:     Head: Normocephalic and atraumatic.  Eyes:     Conjunctiva/sclera: Conjunctivae normal.     Pupils: Pupils are equal, round, and reactive to light.  Cardiovascular:     Rate and Rhythm: Normal rate and regular rhythm.     Heart sounds: Normal heart sounds.  Pulmonary:     Effort: Pulmonary effort is normal.     Breath sounds: Normal breath sounds.  Abdominal:     General: Bowel sounds are normal.     Palpations: Abdomen is soft.  Musculoskeletal:        General: Normal range of motion.     Cervical back: Normal range of motion.     Comments: Right lower leg is normal in appearance, there is no significant no calf asymmetry when compared with left, no palpable cord, DP pulse intact, normal motor function, ambulatory  Skin:    General: Skin is warm and dry.  Neurological:     Mental Status: She is  alert and oriented to person, place, and time.     ED Results / Procedures / Treatments   Labs (all labs ordered are listed, but only abnormal results are displayed) Labs Reviewed - No data to display  EKG None  Radiology No results found.  Procedures Procedures    Medications Ordered in ED Medications - No data to display  ED Course/ Medical Decision Making/ A&P                             Medical Decision Making  47 year old female presenting to the ED with right lower leg pain.  Seen recently for same with reassuring workup including CK and D-dimer that were both negative.  She does not have any calf asymmetry or palpable cords on exam.  Her leg is neurovascularly intact.  She is requesting vascular study.   She was made aware this type of ultrasound is not available at this hour, I have scheduled her to have this done at [facility] vascular lab in the morning.  If abnormal results, she will be directed back to the ED, otherwise will need to follow-up with PCP.  Return here for any new/acute changes.  Final Clinical Impression(s) / ED Diagnoses Final diagnoses:  Right leg pain    Rx / DC Orders ED Discharge Orders          Ordered    LE VENOUS        06/25/23 0215              Garlon Hatchet, PA-C 06/25/23 7829    Glynn Octave, MD 06/25/23 [phone]

## 2023-06-25 NOTE — Discharge Instructions (Signed)
Return to vascular lab in the morning to have ultrasound done.

## 2023-06-25 NOTE — Progress Notes (Signed)
Right lower extremity venous study completed.   Please see CV Procedures for preliminary results.  Jori Thrall, RVT  12:55 PM 06/25/23

## 2024-09-30 ENCOUNTER — Emergency Department (HOSPITAL_BASED_OUTPATIENT_CLINIC_OR_DEPARTMENT_OTHER): Admission: EM | Admit: 2024-09-30 | Discharge: 2024-09-30 | Disposition: A | Discharge status: Home / Self Care

## 2024-09-30 ENCOUNTER — Other Ambulatory Visit: Payer: Self-pay

## 2024-09-30 ENCOUNTER — Emergency Department (HOSPITAL_BASED_OUTPATIENT_CLINIC_OR_DEPARTMENT_OTHER): Admitting: Radiology

## 2024-09-30 ENCOUNTER — Encounter (HOSPITAL_BASED_OUTPATIENT_CLINIC_OR_DEPARTMENT_OTHER): Payer: Self-pay | Admitting: Emergency Medicine

## 2024-09-30 DIAGNOSIS — B349 Viral infection, unspecified: Secondary | ICD-10-CM | POA: Diagnosis not present

## 2024-09-30 DIAGNOSIS — R051 Acute cough: Secondary | ICD-10-CM

## 2024-09-30 DIAGNOSIS — Z91040 Latex allergy status: Secondary | ICD-10-CM | POA: Insufficient documentation

## 2024-09-30 LAB — RESP PANEL BY RT-PCR (RSV, FLU A&B, COVID)  RVPGX2
Influenza A by PCR: NEGATIVE
Influenza B by PCR: NEGATIVE
Resp Syncytial Virus by PCR: NEGATIVE
SARS Coronavirus 2 by RT PCR: NEGATIVE

## 2024-09-30 MED ORDER — IPRATROPIUM-ALBUTEROL 0.5-2.5 (3) MG/3ML IN SOLN
3.0000 mL | RESPIRATORY_TRACT | Status: AC
  Administered 2024-09-30: 3 mL via RESPIRATORY_TRACT
  Filled 2024-09-30: qty 3

## 2024-09-30 NOTE — ED Notes (Signed)
Patient verbalizes understanding of discharge instructions. Opportunity for questioning and answers were provided. Armband removed by staff, pt discharged from ED. Ambulated out to lobby  

## 2024-09-30 NOTE — ED Provider Notes (Signed)
[facility]MENT AT [facility] Provider Note   CSN: [id number] Arrival date & time: 09/30/24  8046     Patient presents with: No chief complaint on file.   Brenda Patton is a 48 y.o. female self reportedly otherwise healthy presents to the ER today for evaluation of cough with runny nose and nasal congestion.  Son sick last week with similar symptoms.  She denies any fever, chills, body aches, shortness of breath.  Does have some chest pain but only with coughing.  She reports a productive cough with mucus, no hemoptysis.  She is not having any vomiting, she is coughing up mucus.  She has no abdominal pain, nausea, or vomiting.  Denies any headache or neck stiffness.  She was seen with telemedicine urgent care and was given cough medication, Tessalon  Perles, but is not like these are helping.  She was coming in for further evaluation.  HPI     Prior to Admission medications   Medication Sig Start Date End Date Taking? Authorizing Provider  albuterol  (VENTOLIN  HFA) 108 (90 Base) MCG/ACT inhaler Inhale into the lungs. 05/10/20   [provider]  albuterol  (VENTOLIN  HFA) 108 (90 Base) MCG/ACT inhaler Inhale 2 puffs into the lungs every 4 (four) hours as needed for wheezing or shortness of breath. 06/23/23   Pollina, Lonni PARAS, MD  ibuprofen  (ADVIL ) 800 MG tablet Take 1 tablet (800 mg total) by mouth every 6 (six) hours as needed for moderate pain. 06/23/23   Haze Lonni PARAS, MD  methocarbamol  (ROBAXIN ) 500 MG tablet Take 1 tablet (500 mg total) by mouth every 8 (eight) hours as needed for muscle spasms. 06/23/23   Haze Lonni PARAS, MD  Multiple Vitamin (MULTI-VITAMIN DAILY PO) Take 1 tablet by mouth daily.    [provider]    Allergies: Penicillins and Latex    Review of Systems  Constitutional:  Negative for chills and fever.  HENT:  Positive for congestion and rhinorrhea. Negative for sore throat.   Respiratory:   Positive for cough. Negative for shortness of breath.   Gastrointestinal:  Negative for abdominal pain, nausea and vomiting.  See HPI  Updated Vital Signs BP 138/79   Pulse 94   Temp 98.3 F (36.8 C) (Oral)   Resp 18   SpO2 97%   Physical Exam Vitals and nursing note reviewed.  Constitutional:      General: She is not in acute distress.    Appearance: She is not ill-appearing or toxic-appearing.  HENT:     Head: Normocephalic and atraumatic.     Right Ear: Tympanic membrane, ear canal and external ear normal.     Left Ear: Tympanic membrane, ear canal and external ear normal.     Nose:     Comments: Bilateral nasal turbinate edema and erythema with scant clear nasal discharge.    Mouth/Throat:     Mouth: Mucous membranes are moist.     Comments: No pharyngeal erythema, exudate, or edema noted.  Uvula midline.  Airway patent.  Moist mucous membranes. Eyes:     General: No scleral icterus.    Conjunctiva/sclera: Conjunctivae normal.  Cardiovascular:     Rate and Rhythm: Normal rate.  Pulmonary:     Effort: Pulmonary effort is normal. No respiratory distress.     Comments: Frequent coughing.  Diffuse chest wall tenderness palpation.  No overlying skin changes noted. Chest:     Chest wall: Tenderness present.  Musculoskeletal:     Cervical back: Normal  range of motion.  Neurological:     General: No focal deficit present.     Mental Status: She is alert.     (all labs ordered are listed, but only abnormal results are displayed) Labs Reviewed  RESP PANEL BY RT-PCR (RSV, FLU A&B, COVID)  [id number]    EKG: None  Radiology: DG Chest 2 View Result Date: 09/30/2024 CLINICAL DATA:  Cough. EXAM: CHEST - 2 VIEW COMPARISON:  June 22, 2023 FINDINGS: The heart size and mediastinal contours are within normal limits. Both lungs are clear. The visualized skeletal structures are unremarkable. IMPRESSION: No active cardiopulmonary disease. Electronically Signed   By: Suzen Dials  M.D.   On: 09/30/2024 21:02   Procedures   Medications Ordered in the ED  ipratropium-albuterol  (DUONEB) 0.5-2.5 (3) MG/3ML nebulizer solution 3 mL (3 mLs Nebulization Given 09/30/24 2230)    Medical Decision Making Amount and/or Complexity of Data Reviewed Radiology: ordered.  Risk Prescription drug management.   48 y.o. female presents to the ER for evaluation of cough. Differential diagnosis includes but is not limited to Upper respiratory infection, lower respiratory infection, allergies/irritants, asthma, reflux, CHF, interstitial lung disease, foreign body, ACE inhibitors. Vital signs mildly elevated BP otherwise unremarkable. Physical exam as noted above.   Patient's lung sounds are clear. She is moving air well.  I have ordered her a DuoNeb treatment to help with her bronchospasm from her coughing.  Patient was given albuterol  inhaler, Hycodan, and Tessalon  Perles with her PCP/telemedicine on the third of this month.  She has tried the Hycodan and [employer] and does not like this helps much.  Patient's chest pain is reproducible upon palpation.  Likely having some muscular pain secondary to frequent coughing.  Will obtain respiratory swab and chest x-ray.  I independently reviewed and interpreted the patient's labs. Resp panel negative.  CXR shows No active cardiopulmonary disease. Per radiologist's interpretation.    After patient received DuoNeb, she reports that she is feeling better.  Coughing has lessened.  She request discharge home.  Discussed with patient to try a over-the-counter Robitussin instead of Tessalon  Perles to see if this helps with her symptoms.  This is likely a viral illness.  Her lungs are clear to auscultation.  Chest x-ray does not show any pneumonia.  She already has an inhaler to help with the bronchospasm.  Recommended cool-mist humidifier as well.  Do not feel she needs any cardiac workup given her chest pain is reproducible upon palpation and only  has chest pain after coughing.  She is not having any vomiting, she is coughing up green mucus.  Her abdomen is soft and nontender.  Her vital signs are stable and she is maintaining her O2.  She does not meet criteria for admission.  She stable for discharge home with strict return precautions.  We discussed the results of the labs/imaging. The plan is supportive care, follow-up. We discussed strict return precautions and red flag symptoms. The patient verbalized their understanding and agrees to the plan. The patient is stable and being discharged home in good condition.  Portions of this report may have been transcribed using voice recognition software. Every effort was made to ensure accuracy; however, inadvertent computerized transcription errors may be present.    Final diagnoses:  Acute cough  Viral illness    ED Discharge Orders     None          Bernis Ernst, [place] 10/07/24 0104    Ula Prentice SAUNDERS, MD 10/07/24  0718  

## 2024-09-30 NOTE — ED Notes (Signed)
 PT ambulated at 98%-100% room air walking the length of the department.

## 2024-09-30 NOTE — Discharge Instructions (Addendum)
 You were seen in the ER today for evaluation of your cough. I think you likely have a viral illness. Your XR does not show any pneumonia and your lung sounds are clear. I think you likely have some chest congestion. I recommend taking either Mucinex  or Robitussin over the counter. Please make sure you follow up with your PCP in person for re-evaluation in the next few days. If you start to feel any shortness of breath, coughing up any blood, feel lightheaded, or pass out, return to the ER. If you have any other concerns, new or worsening symptoms, please return to the nearest ER for evaluation.   Contact a health care provider if: You have new symptoms, or your symptoms get worse. You cough up pus. You have a fever that does not go away or a cough that does not get better after 2-3 weeks. You cannot control your cough with medicine, and you are losing sleep. You have pain that gets worse or is not helped with medicine. You lose weight for no clear reason. You have night sweats. Get help right away if: You cough up blood. You have trouble breathing. Your heart is beating very fast. These symptoms may be an emergency. Get help right away. Call 911. Do not wait to see if the symptoms will go away. Do not drive yourself to the hospital.

## 2024-09-30 NOTE — ED Triage Notes (Signed)
 Cough since Monday. Throat hoarse with phlegm. Occasional emesis with coughing fits. Cough medicine given by PCP and COVID swab negative.
